# Patient Record
Sex: Female | Born: 1965 | Race: Black or African American | Hispanic: No | Marital: Single | Smoking: Never smoker
Health system: Southern US, Community
[De-identification: ages and names within clinical notes are randomized; demographics above are authoritative.]

## PROBLEM LIST (undated history)

## (undated) DIAGNOSIS — D649 Anemia, unspecified: Secondary | ICD-10-CM

## (undated) DIAGNOSIS — I10 Essential (primary) hypertension: Secondary | ICD-10-CM

## (undated) DIAGNOSIS — F411 Generalized anxiety disorder: Secondary | ICD-10-CM

## (undated) DIAGNOSIS — E119 Type 2 diabetes mellitus without complications: Secondary | ICD-10-CM

## (undated) HISTORY — DX: Generalized anxiety disorder: F41.1

## (undated) HISTORY — DX: Essential (primary) hypertension: I10

## (undated) HISTORY — DX: Anemia, unspecified: D64.9

## (undated) HISTORY — DX: Type 2 diabetes mellitus without complications: E11.9

---

## 2013-03-18 ENCOUNTER — Ambulatory Visit: Payer: Self-pay | Admitting: Family Medicine

## 2013-04-01 ENCOUNTER — Encounter: Payer: Self-pay | Admitting: Family Medicine

## 2013-04-01 ENCOUNTER — Ambulatory Visit (INDEPENDENT_AMBULATORY_CARE_PROVIDER_SITE_OTHER): Payer: BC Managed Care – PPO | Admitting: Family Medicine

## 2013-04-01 ENCOUNTER — Encounter (INDEPENDENT_AMBULATORY_CARE_PROVIDER_SITE_OTHER): Payer: Self-pay

## 2013-04-01 VITALS — BP 150/98 | HR 98 | Resp 18 | Ht 67.0 in | Wt 207.0 lb

## 2013-04-01 DIAGNOSIS — Z1239 Encounter for other screening for malignant neoplasm of breast: Secondary | ICD-10-CM

## 2013-04-01 DIAGNOSIS — IMO0001 Reserved for inherently not codable concepts without codable children: Secondary | ICD-10-CM

## 2013-04-01 DIAGNOSIS — M79641 Pain in right hand: Secondary | ICD-10-CM

## 2013-04-01 DIAGNOSIS — F411 Generalized anxiety disorder: Secondary | ICD-10-CM

## 2013-04-01 DIAGNOSIS — I10 Essential (primary) hypertension: Secondary | ICD-10-CM | POA: Insufficient documentation

## 2013-04-01 DIAGNOSIS — E669 Obesity, unspecified: Secondary | ICD-10-CM

## 2013-04-01 DIAGNOSIS — M79609 Pain in unspecified limb: Secondary | ICD-10-CM

## 2013-04-01 DIAGNOSIS — R030 Elevated blood-pressure reading, without diagnosis of hypertension: Secondary | ICD-10-CM

## 2013-04-01 DIAGNOSIS — G4700 Insomnia, unspecified: Secondary | ICD-10-CM

## 2013-04-01 DIAGNOSIS — E66811 Obesity, class 1: Secondary | ICD-10-CM

## 2013-04-01 DIAGNOSIS — E663 Overweight: Secondary | ICD-10-CM | POA: Insufficient documentation

## 2013-04-01 MED ORDER — TEMAZEPAM 7.5 MG PO CAPS: 7.5000 mg | ORAL_CAPSULE | Freq: Every evening | ORAL | Status: DC | PRN

## 2013-04-01 MED ORDER — BUPROPION HCL ER (SR) 150 MG PO TB12: 150.0000 mg | ORAL_TABLET | Freq: Two times a day (BID) | ORAL | Status: DC

## 2013-04-01 NOTE — Patient Instructions (Signed)
F/u in 5 weeks call if you need me before  Fasting CBC, lipid, , chem 7 HBA1C, TSH., Vit D As soon as possible   New for sleep is restoril, we will also provide you with info re good sleep hygiene pls follow this, no light no soound when sleeping please, and set bedtime  You are referred to ortho re hand pain, using a brace at night  Will likely reduce pain in the interim, seems like you have carpal tunnel syndrom in the right hand  Blood pressure is high today, please commit to daily exercise, changing eating to regular  Times and mainly fruit and vegetable based, weight loss is needed to improve your health also  Continue to look at opportunities to reduce the mental stress associated with daily living  It is important that you exercise regularly at least 30 minutes 5 times a week. If you develop chest pain, have severe difficulty breathing, or feel very tired, stop exercising immediately and seek medical attention    A healthy diet is rich in fruit, vegetables and whole grains. Poultry fish, nuts and beans are a healthy choice for protein rather then red meat. A low sodium diet and drinking 64 ounces of water daily is generally recommended. Oils and sweet should be limited. Carbohydrates especially for those who are diabetic or overweight, should be limited to 60-45 gram per meal. It is important to eat on a regular schedule, at least 3 times daily. Snacks should be primarily fruits, vegetables or nuts.

## 2013-04-08 ENCOUNTER — Telehealth: Payer: Self-pay | Admitting: Family Medicine

## 2013-04-08 MED ORDER — TRIAMTERENE-HCTZ 37.5-25 MG PO TABS: 1.0000 | ORAL_TABLET | Freq: Every day | ORAL | Status: DC

## 2013-04-08 NOTE — Telephone Encounter (Signed)
Message left for patient to return my call.  

## 2013-04-08 NOTE — Telephone Encounter (Signed)
pls let pt know 2 month supply of maxzide has been sent in , 1 daily in the mornings, will go bathroom to urinate more often initially (diuretic in it) call if probs.   Ensure she has apptbackher within next 7 weeks on this med if not needs one re eval BP in next approx 5 weeks

## 2013-04-16 NOTE — Telephone Encounter (Signed)
Patient aware.

## 2013-04-18 ENCOUNTER — Ambulatory Visit
Admission: RE | Admit: 2013-04-18 | Discharge: 2013-04-18 | Disposition: A | Discharge status: Home / Self Care | Payer: BC Managed Care – PPO | Source: Ambulatory Visit | Attending: Family Medicine | Admitting: Family Medicine

## 2013-04-18 DIAGNOSIS — Z1239 Encounter for other screening for malignant neoplasm of breast: Secondary | ICD-10-CM

## 2013-05-03 NOTE — Assessment & Plan Note (Signed)
Sleep hygiene reviewed and written information offered also. Prescription sent for  medication needed.  

## 2013-05-03 NOTE — Assessment & Plan Note (Signed)
No past history, pt is new to practice , no med started today/ DASH diet and commitment to daily physical activity for a minimum of 30 minutes discussed and encouraged, as a part of hypertension management. The importance of attaining a healthy weight is also discussed. Reassess next visit

## 2013-05-03 NOTE — Assessment & Plan Note (Signed)
.   Patient educated about  the importance of commitment to a  minimum of 150 minutes of exercise per week. The importance of healthy food choices with portion control discussed. Encouraged to start a food diary, count calories and to consider  joining a support group. Sample diet sheets offered. Goals set by the patient for the next several months.    

## 2013-05-03 NOTE — Progress Notes (Signed)
   Subjective:    Patient ID: Felicia HookJanet Schneider, female    DOB: 07/21/1965, 48 y.o.   MRN: [id number]  HPI Pt presents in the office to establish care. She has no chronic medical conditions fo which she takes prescription medications. She is concerned about significant weight gain in recent 12 to 18 months, which she attributes to stress.  Has a lot of travel on her job, her Mom is ill and increasingly dependent, and she is divorced with her 2 children in college, does state that their father is supportive. C/o poor sleep, an is aware that she is spending insufficient time and energy attending to her physical needs of regular exercise and good nutrtition, wants to change this   Review of Systems See HPI Denies recent fever or chills. Denies sinus pressure, nasal congestion, ear pain or sore throat. Denies chest congestion, productive cough or wheezing. Denies chest pains, palpitations and leg swelling Denies abdominal pain, nausea, vomiting,diarrhea or constipation.   Denies dysuria, frequency, hesitancy or incontinence. C/o hand pain and numbness with mild weakness, pain awakens her at night and is relieved to some extent with the flick of a hand Denies headaches, seizures, numbness, or tingling. Denies depression, does report  Anxiety and  Insomnia due to normal stresses of life Denies skin break down or rash.        Objective:   Physical Exam BP 150/98  Pulse 98  Resp 18  Ht 5\' 7"  (1.702 m)  Wt 207 lb (93.895 kg)  BMI 32.41 kg/m2  SpO2 100%  LMP 03/31/2013 Patient alert and oriented and in no cardiopulmonary distress.  HEENT: No facial asymmetry, EOMI, no sinus tenderness,  oropharynx pink and moist.  Neck supple no adenopathy.  Chest: Clear to auscultation bilaterally.  CVS: S1, S2 no murmurs, no S3.  ABD: Soft non tender. Bowel sounds normal.  Ext: No edema  MS: Adequate ROM spine, shoulders, hips and knees.  Skin: Intact, no ulcerations or rash noted.  Psych:  Good eye contact, normal affect. Memory intact not anxious or depressed appearing.  CNS: CN 2-12 intact, power, tone and sensation normal throughout.        Assessment & Plan:  Insomnia Sleep hygiene reviewed and written information offered also. Prescription sent for  medication needed.   Hand pain, right History and exam are c/w carpal tunnel synd , will refer to ortho hand for eval and management  GAD (generalized anxiety disorder) Generalized anxiety  Associated with the stresses of dailoy living. Behavior modification to incorporate daily exercise and adequate sleep will greatly alleviate this  Elevated blood pressure No past history, pt is new to practice , no med started today/ DASH diet and commitment to daily physical activity for a minimum of 30 minutes discussed and encouraged, as a part of hypertension management. The importance of attaining a healthy weight is also discussed. Reassess next visit  Obesity (BMI 30.0-34.9)  Patient educated about  the importance of commitment to a  minimum of 150 minutes of exercise per week. The importance of healthy food choices with portion control discussed. Encouraged to start a food diary, count calories and to consider  joining a support group. Sample diet sheets offered. Goals set by the patient for the next several months.

## 2013-05-03 NOTE — Assessment & Plan Note (Signed)
Generalized anxiety  Associated with the stresses of dailoy living. Behavior modification to incorporate daily exercise and adequate sleep will greatly alleviate this

## 2013-05-03 NOTE — Assessment & Plan Note (Signed)
History and exam are c/w carpal tunnel synd , will refer to ortho hand for eval and management

## 2013-06-12 ENCOUNTER — Other Ambulatory Visit: Payer: Self-pay | Admitting: Family Medicine

## 2013-10-30 ENCOUNTER — Other Ambulatory Visit: Payer: Self-pay | Admitting: Family Medicine

## 2014-02-25 ENCOUNTER — Other Ambulatory Visit: Payer: Self-pay | Admitting: Family Medicine

## 2014-02-25 LAB — CBC
HCT: 39 % (ref 36.0–46.0)
Hemoglobin: 12.9 g/dL (ref 12.0–15.0)
MCH: 28.2 pg (ref 26.0–34.0)
MCHC: 33.1 g/dL (ref 30.0–36.0)
MCV: 85.2 fL (ref 78.0–100.0)
MPV: 9.3 fL — ABNORMAL LOW (ref 9.4–12.4)
Platelets: 447 10*3/uL — ABNORMAL HIGH (ref 150–400)
RBC: 4.58 MIL/uL (ref 3.87–5.11)
RDW: 16.6 % — AB (ref 11.5–15.5)
WBC: 6.4 10*3/uL (ref 4.0–10.5)

## 2014-02-25 LAB — HEMOGLOBIN A1C
HEMOGLOBIN A1C: 7 % — AB (ref <5.7)
MEAN PLASMA GLUCOSE: 154 mg/dL — AB (ref <117)

## 2014-02-25 LAB — LIPID PANEL
CHOLESTEROL: 156 mg/dL (ref 0–200)
HDL: 42 mg/dL (ref >39)
LDL Cholesterol: 90 mg/dL (ref 0–99)
Total CHOL/HDL Ratio: 3.7 Ratio
Triglycerides: 119 mg/dL (ref <150)
VLDL: 24 mg/dL (ref 0–40)

## 2014-02-25 LAB — BASIC METABOLIC PANEL
BUN: 21 mg/dL (ref 6–23)
CALCIUM: 9.6 mg/dL (ref 8.4–10.5)
CO2: 29 mEq/L (ref 19–32)
CREATININE: 0.98 mg/dL (ref 0.50–1.10)
Chloride: 102 mEq/L (ref 96–112)
Glucose, Bld: 136 mg/dL — ABNORMAL HIGH (ref 70–99)
POTASSIUM: 4 meq/L (ref 3.5–5.3)
Sodium: 140 mEq/L (ref 135–145)

## 2014-02-25 LAB — VITAMIN D 25 HYDROXY (VIT D DEFICIENCY, FRACTURES): VIT D 25 HYDROXY: 37 ng/mL (ref 30–100)

## 2014-02-25 LAB — TSH: TSH: 1.678 u[IU]/mL (ref 0.350–4.500)

## 2014-03-02 ENCOUNTER — Encounter: Payer: Self-pay | Admitting: Family Medicine

## 2014-03-02 ENCOUNTER — Ambulatory Visit (INDEPENDENT_AMBULATORY_CARE_PROVIDER_SITE_OTHER): Payer: BC Managed Care – PPO | Admitting: Family Medicine

## 2014-03-02 ENCOUNTER — Encounter (INDEPENDENT_AMBULATORY_CARE_PROVIDER_SITE_OTHER): Payer: Self-pay

## 2014-03-02 VITALS — BP 130/82 | HR 74 | Resp 18 | Ht 67.0 in | Wt 212.0 lb

## 2014-03-02 DIAGNOSIS — R030 Elevated blood-pressure reading, without diagnosis of hypertension: Secondary | ICD-10-CM

## 2014-03-02 DIAGNOSIS — E119 Type 2 diabetes mellitus without complications: Secondary | ICD-10-CM

## 2014-03-02 DIAGNOSIS — E785 Hyperlipidemia, unspecified: Secondary | ICD-10-CM

## 2014-03-02 DIAGNOSIS — E1159 Type 2 diabetes mellitus with other circulatory complications: Secondary | ICD-10-CM | POA: Insufficient documentation

## 2014-03-02 DIAGNOSIS — Z23 Encounter for immunization: Secondary | ICD-10-CM

## 2014-03-02 DIAGNOSIS — E1165 Type 2 diabetes mellitus with hyperglycemia: Secondary | ICD-10-CM | POA: Insufficient documentation

## 2014-03-02 DIAGNOSIS — M549 Dorsalgia, unspecified: Secondary | ICD-10-CM

## 2014-03-02 DIAGNOSIS — E669 Obesity, unspecified: Secondary | ICD-10-CM

## 2014-03-02 DIAGNOSIS — IMO0001 Reserved for inherently not codable concepts without codable children: Secondary | ICD-10-CM

## 2014-03-02 DIAGNOSIS — M545 Low back pain: Secondary | ICD-10-CM

## 2014-03-02 DIAGNOSIS — E1169 Type 2 diabetes mellitus with other specified complication: Secondary | ICD-10-CM

## 2014-03-02 MED ORDER — METHYLPREDNISOLONE ACETATE 80 MG/ML IJ SUSP
80.0000 mg | Freq: Once | INTRAMUSCULAR | Status: AC
  Administered 2014-03-02: 80 mg via INTRAMUSCULAR

## 2014-03-02 MED ORDER — PREDNISONE 5 MG PO TABS: 5.0000 mg | ORAL_TABLET | Freq: Two times a day (BID) | ORAL | Status: DC

## 2014-03-02 MED ORDER — METFORMIN HCL 500 MG PO TABS: 500.0000 mg | ORAL_TABLET | Freq: Two times a day (BID) | ORAL | Status: DC

## 2014-03-02 MED ORDER — KETOROLAC TROMETHAMINE 60 MG/2ML IM SOLN
60.0000 mg | Freq: Once | INTRAMUSCULAR | Status: AC
  Administered 2014-03-02: 60 mg via INTRAMUSCULAR

## 2014-03-02 MED ORDER — IBUPROFEN 800 MG PO TABS: 800.0000 mg | ORAL_TABLET | Freq: Two times a day (BID) | ORAL | Status: DC | PRN

## 2014-03-02 MED ORDER — RAMIPRIL 2.5 MG PO CAPS: 2.5000 mg | ORAL_CAPSULE | Freq: Every day | ORAL | Status: DC

## 2014-03-02 NOTE — Progress Notes (Signed)
   Subjective:    Patient ID: Felicia Schneider, female    DOB: 07/16/1965, 48 y.o.   MRN: [id number]  HPI The PT is here for follow up and re-evaluation of chronic medical conditions, medication management and review of any available recent lab and radiology data.  Preventive health is updated, specifically  Cancer screening and Immunization.    The PT denies any adverse reactions to current medications . New onset  low back pain radiating to buttock limiting mobility , aggravated by excessive physical activity this past weekend      Review of Systems See HPI Denies recent fever or chills. Denies sinus pressure, nasal congestion, ear pain or sore throat. Denies chest congestion, productive cough or wheezing. Denies chest pains, palpitations and leg swelling Denies abdominal pain, nausea, vomiting,diarrhea or constipation.   Denies dysuria, frequency, hesitancy or incontinence.  Denies headaches, seizures,  Denies depression, anxiety or insomnia. Denies skin break down or rash.        Objective:   Physical Exam BP 130/82 mmHg  Pulse 74  Resp 18  Ht 5\' 7"  (1.702 m)  Wt 212 lb 0.6 oz (96.181 kg)  BMI 33.20 kg/m2  SpO2 97% Patient alert and oriented and in no cardiopulmonary distress.Pt in pain with marked limitation in mobility  HEENT: No facial asymmetry, EOMI,   oropharynx pink and moist.  Neck supple no JVD, no mass.  Chest: Clear to auscultation bilaterally.  CVS: S1, S2 no murmurs, no S3.Regular rate.  ABD: Soft non tender.   Ext: No edema  MS: decreased ROM lumbar spine, normal in shoulders, hips and knees  Skin: Intact, no ulcerations or rash noted.  Psych: Good eye contact, normal affect. Memory intact not anxious or depressed appearing.  CNS: CN 2-12 intact, power,  normal throughout.no focal deficits noted.        Assessment & Plan:  Diabetes mellitus type 2 in obese Patient educated about the importance of limiting  Carbohydrate intake , the need  to commit to daily physical activity for a minimum of 30 minutes , and to commit weight loss. The fact that changes in all these areas will reduce or eliminate all together the development of diabetes is stressed.   Diabetic ed started at visit, pt referred to individual class Once daily testing with blood glucose ranges are reviewed  Elevated blood pressure Controlled, no change in medication Add ramipril fdor renal protection DASH diet and commitment to daily physical activity for a minimum of 30 minutes discussed and encouraged, as a part of hypertension management. The importance of attaining a healthy weight is also discussed.   Obesity (BMI 30.0-34.9) /Deteriorated. Patient re-educated about  the importance of commitment to a  minimum of 150 minutes of exercise per week. The importance of healthy food choices with portion control discussed. Encouraged to start a food diary, count calories and to consider  joining a support group. Sample diet sheets offered. Goals set by the patient for the next several months.     Need for diphtheria-tetanus-pertussis (Tdap) vaccine, adult/adolescent Vaccine administered at visit.   Acute back pain Anti inflammatories in office and prescribed

## 2014-03-02 NOTE — Patient Instructions (Addendum)
F/u in 3 month, call if you need me before  TdAP today in office  You are referred to diabetic class.    Commit to exercise for 30 mins at least 5 days per week    New is metformin 500 mg one twice daily, start taking one daily for 4 days , then increase to twice daily as prescribed  For back pain toradol 72m and depo medrol 80-mg is given IM, then ibuprofen and prednisone are prescribed for 5 days only, call back if no better for testing etc  New additional medication is prescribed for kidney protection, altace one daily  Hba1C , chem 7 and EGFR in 3 month, NEED TO KEEP APPT

## 2014-03-04 LAB — MICROALBUMIN / CREATININE URINE RATIO
CREATININE, URINE: 157.7 mg/dL
Microalb Creat Ratio: 6.3 mg/g (ref 0.0–30.0)
Microalb, Ur: 1 mg/dL (ref <2.0)

## 2014-03-10 DIAGNOSIS — Z23 Encounter for immunization: Secondary | ICD-10-CM | POA: Insufficient documentation

## 2014-03-10 DIAGNOSIS — M549 Dorsalgia, unspecified: Secondary | ICD-10-CM | POA: Insufficient documentation

## 2014-03-10 NOTE — Assessment & Plan Note (Signed)
Vaccine administered at visit.  

## 2014-03-10 NOTE — Assessment & Plan Note (Signed)
Controlled, no change in medication Add ramipril fdor renal protection DASH diet and commitment to daily physical activity for a minimum of 30 minutes discussed and encouraged, as a part of hypertension management. The importance of attaining a healthy weight is also discussed.

## 2014-03-10 NOTE — Assessment & Plan Note (Signed)
Deteriorated. Patient re-educated about  the importance of commitment to a  minimum of 150 minutes of exercise per week. The importance of healthy food choices with portion control discussed. Encouraged to start a food diary, count calories and to consider  joining a support group. Sample diet sheets offered. Goals set by the patient for the next several months.    

## 2014-03-10 NOTE — Assessment & Plan Note (Signed)
Anti inflammatories in office and prescribed

## 2014-03-10 NOTE — Assessment & Plan Note (Signed)
Patient educated about the importance of limiting  Carbohydrate intake , the need to commit to daily physical activity for a minimum of 30 minutes , and to commit weight loss. The fact that changes in all these areas will reduce or eliminate all together the development of diabetes is stressed.   Diabetic ed started at visit, pt referred to individual class Once daily testing with blood glucose ranges are reviewed

## 2014-03-11 ENCOUNTER — Encounter: Payer: Self-pay | Admitting: Women's Health

## 2014-03-11 ENCOUNTER — Ambulatory Visit (INDEPENDENT_AMBULATORY_CARE_PROVIDER_SITE_OTHER): Payer: BC Managed Care – PPO | Admitting: Women's Health

## 2014-03-11 ENCOUNTER — Other Ambulatory Visit (HOSPITAL_COMMUNITY)
Admission: RE | Admit: 2014-03-11 | Discharge: 2014-03-11 | Disposition: A | Discharge status: Home / Self Care | Payer: BC Managed Care – PPO | Source: Ambulatory Visit | Attending: Women's Health | Admitting: Women's Health

## 2014-03-11 VITALS — BP 130/82 | Ht 70.0 in | Wt 205.2 lb

## 2014-03-11 DIAGNOSIS — N760 Acute vaginitis: Secondary | ICD-10-CM

## 2014-03-11 DIAGNOSIS — B9689 Other specified bacterial agents as the cause of diseases classified elsewhere: Secondary | ICD-10-CM

## 2014-03-11 DIAGNOSIS — A499 Bacterial infection, unspecified: Secondary | ICD-10-CM

## 2014-03-11 DIAGNOSIS — Z01419 Encounter for gynecological examination (general) (routine) without abnormal findings: Secondary | ICD-10-CM

## 2014-03-11 DIAGNOSIS — Z1151 Encounter for screening for human papillomavirus (HPV): Secondary | ICD-10-CM | POA: Insufficient documentation

## 2014-03-11 LAB — WET PREP FOR TRICH, YEAST, CLUE
TRICH WET PREP: NONE SEEN
YEAST WET PREP: NONE SEEN

## 2014-03-11 MED ORDER — METRONIDAZOLE 0.75 % VA GEL: VAGINAL | Status: DC

## 2014-03-11 NOTE — Progress Notes (Signed)
Felicia HookJanet Schneider 06/08/1965 [id number]    History:    Presents for annual exam. New patient. Mostly monthly cycle/condoms. Monthly prior to this past year. Reports normal Pap and mammogram history.  Past medical history, past surgical history, family history and social history were all reviewed and documented in the EPIC chart. 50 year old son at [facility] [employer] [facility], 50 year old daughter at [employer],  both doing well. Mother hypertension/hypercholesterolemia/diabetes.  ROS:  A ROS was performed and pertinent positives and negatives are included.  Exam:  Filed Vitals:   03/11/14 0848  BP: 130/82    General appearance:  Normal Thyroid:  Symmetrical, normal in size, without palpable masses or nodularity. Respiratory  Auscultation:  Clear without wheezing or rhonchi Cardiovascular  Auscultation:  Regular rate, without rubs, murmurs or gallops  Edema/varicosities:  Not grossly evident Abdominal  Soft,nontender, without masses, guarding or rebound.  Liver/spleen:  No organomegaly noted  Hernia:  None appreciated  Skin  Inspection:  Grossly normal   Breasts: Examined lying and sitting.     Right: Without masses, retractions, discharge or axillary adenopathy.     Left: Without masses, retractions, discharge or axillary adenopathy. Gentitourinary   Inguinal/mons:  Normal without inguinal adenopathy  External genitalia:  Normal  BUS/Urethra/Skene's glands:  Normal  Vagina: Malodorous adherent discharge wet prep positive for amines, clues, TNTC bacteria  Cervix:  Normal  Uterus:   normal in size, shape and contour.  Midline and mobile  Adnexa/parametria:     Rt: Without masses or tenderness.   Lt: Without masses or tenderness.  Anus and perineum: Normal  Digital rectal exam: Normal sphincter tone without palpated masses or tenderness  Assessment/Plan:  48 y.o.MBF G2P2  for annual exam with no complaints.  Normal perimenopausal exam/condoms Diabetes-primary care manages labs and  meds Obesity Bacterial vaginosis  Plan: SBE's, regular exercise, calcium rich diet, vitamin D 2000 daily encouraged. Vitamin D 37 at primary care. Decrease calories for weight loss encouraged. Pap with HR HPV typing, new screening guidelines reviewed. Contraception reviewed and declined will continue condoms. MetroGel vaginal cream 1 applicator at bedtime 5, alcohol precautions reviewed.  Felicia Schneider J WHNP, 1:40 PM 03/11/2014

## 2014-03-12 LAB — CYTOLOGY - PAP

## 2014-03-16 ENCOUNTER — Encounter: Payer: Self-pay | Admitting: Internal Medicine

## 2014-03-17 ENCOUNTER — Encounter: Payer: Self-pay | Admitting: Women's Health

## 2014-03-17 ENCOUNTER — Other Ambulatory Visit: Payer: Self-pay

## 2014-03-17 ENCOUNTER — Telehealth: Payer: Self-pay | Admitting: Family Medicine

## 2014-03-17 MED ORDER — TRIAMTERENE-HCTZ 37.5-25 MG PO TABS: 1.0000 | ORAL_TABLET | Freq: Every day | ORAL | Status: DC

## 2014-03-17 NOTE — Telephone Encounter (Signed)
Med refilled to requested pharmacy  

## 2014-03-19 ENCOUNTER — Other Ambulatory Visit: Payer: Self-pay

## 2014-03-19 MED ORDER — TRIAMTERENE-HCTZ 37.5-25 MG PO TABS: 1.0000 | ORAL_TABLET | Freq: Every day | ORAL | Status: DC

## 2014-05-04 ENCOUNTER — Ambulatory Visit (AMBULATORY_SURGERY_CENTER): Payer: Self-pay | Admitting: *Deleted

## 2014-05-04 VITALS — Ht 69.0 in | Wt 202.0 lb

## 2014-05-04 DIAGNOSIS — Z1211 Encounter for screening for malignant neoplasm of colon: Secondary | ICD-10-CM

## 2014-05-04 MED ORDER — MOVIPREP 100 G PO SOLR
ORAL | Status: DC
Start: 2014-05-04 — End: 2014-05-08

## 2014-05-04 NOTE — Progress Notes (Signed)
No egg or soy allergy  No trouble moving neck- never has had anesthesia  No diet medications  [facility]

## 2014-05-06 ENCOUNTER — Telehealth: Payer: Self-pay | Admitting: Internal Medicine

## 2014-05-06 NOTE — Telephone Encounter (Signed)
If temp < 100F may proceed with colonoscopy.

## 2014-05-06 NOTE — Telephone Encounter (Signed)
Pt states she has developed a cough and that she had a temp of 99.3 today. She has been taking theraflu for her cough. Pt wants to know if she needs to reschedule her colon that is scheduled for Friday. Please advise.

## 2014-05-07 NOTE — Telephone Encounter (Signed)
Patient notified of recommendation. 

## 2014-05-08 ENCOUNTER — Ambulatory Visit (AMBULATORY_SURGERY_CENTER): Payer: BLUE CROSS/BLUE SHIELD | Admitting: Internal Medicine

## 2014-05-08 ENCOUNTER — Encounter: Payer: Self-pay | Admitting: Internal Medicine

## 2014-05-08 VITALS — BP 133/78 | HR 77 | Temp 100.2°F | Resp 16 | Ht 70.0 in | Wt 205.0 lb

## 2014-05-08 DIAGNOSIS — Z1211 Encounter for screening for malignant neoplasm of colon: Secondary | ICD-10-CM

## 2014-05-08 LAB — GLUCOSE, CAPILLARY
Glucose-Capillary: 110 mg/dL — ABNORMAL HIGH (ref 70–99)
Glucose-Capillary: 90 mg/dL (ref 70–99)

## 2014-05-08 MED ORDER — SODIUM CHLORIDE 0.9 % IV SOLN: 500.0000 mL | INTRAVENOUS | Status: DC

## 2014-05-08 NOTE — Progress Notes (Addendum)
Pt in today wit temp 100.2. Okay to proceed with procedure per Dr Juanda ChanceBrodie. Some cough started after PV Tuesday , but no other s/s

## 2014-05-08 NOTE — Patient Instructions (Signed)
Normal colon today, repeat colonoscopy in 10 years. Try to follow high fiber diet. Handout given on high fiber diet. Call us with any questions or concerns. Thank you!  YOU HAD AN ENDOSCOPIC PROCEDURE TODAY AT THE [facility]: Refer to the procedure report that was given to you for any specific questions about what was found during the examination.  If the procedure report does not answer your questions, please call your gastroenterologist to clarify.  If you requested that your care partner not be given the details of your procedure findings, then the procedure report has been included in a sealed envelope for you to review at your convenience later.  YOU SHOULD EXPECT: Some feelings of bloating in the abdomen. Passage of more gas than usual.  Walking can help get rid of the air that was put into your GI tract during the procedure and reduce the bloating. If you had a lower endoscopy (such as a colonoscopy or flexible sigmoidoscopy) you may notice spotting of blood in your stool or on the toilet paper. If you underwent a bowel prep for your procedure, then you may not have a normal bowel movement for a few days.  DIET: Your first meal following the procedure should be a light meal and then it is ok to progress to your normal diet.  A half-sandwich or bowl of soup is an example of a good first meal.  Heavy or fried foods are harder to digest and may make you feel nauseous or bloated.  Likewise meals heavy in dairy and vegetables can cause extra gas to form and this can also increase the bloating.  Drink plenty of fluids but you should avoid alcoholic beverages for 24 hours.  ACTIVITY: Your care partner should take you home directly after the procedure.  You should plan to take it easy, moving slowly for the rest of the day.  You can resume normal activity the day after the procedure however you should NOT DRIVE or use heavy machinery for 24 hours (because of the sedation medicines used during  the test).    SYMPTOMS TO REPORT IMMEDIATELY: A gastroenterologist can be reached at any hour.  During normal business hours, 8:30 AM to 5:00 PM Monday through Friday, call [phone].  After hours and on weekends, please call the GI answering service at [phone] who will take a message and have the physician on call contact you.   Following lower endoscopy (colonoscopy or flexible sigmoidoscopy):  Excessive amounts of blood in the stool  Significant tenderness or worsening of abdominal pains  Swelling of the abdomen that is new, acute  Fever of 100F or higher  Following upper endoscopy (EGD)  Vomiting of blood or coffee ground material  New chest pain or pain under the shoulder blades  Painful or persistently difficult swallowing  New shortness of breath  Fever of 100F or higher  Black, tarry-looking stools  FOLLOW UP: If any biopsies were taken you will be contacted by phone or by letter within the next 1-3 weeks.  Call your gastroenterologist if you have not heard about the biopsies in 3 weeks.  Our staff will call the home number listed on your records the next business day following your procedure to check on you and address any questions or concerns that you may have at that time regarding the information given to you following your procedure. This is a courtesy call and so if there is no answer at the home number and we have  heard from you through the emergency physician on call, we will assume that you have returned to your regular daily activities without incident.  SIGNATURES/CONFIDENTIALITY: You and/or your care partner have signed paperwork which will be entered into your electronic medical record.  These signatures attest to the fact that that the information above on your After Visit Summary has been reviewed and is understood.  Full responsibility of the confidentiality of this discharge information lies with you and/or your care-partner. 

## 2014-05-08 NOTE — Op Note (Signed)
[facility] [address] [place], [id number]   COLONOSCOPY PROCEDURE REPORT  PATIENT: Felicia Schneider, Sawyer  MR#: [id number] BIRTHDATE: 17-Oct-1965 , 48  yrs. old GENDER: female ENDOSCOPIST: Hart Carwinora M Binnie Droessler, MD REFERRED BY:Dr margaret Simpson PROCEDURE DATE:  05/08/2014 PROCEDURE:   Colonoscopy, screening First Screening Colonoscopy - Avg.  risk and is 50 yrs.  old or older Yes.  Prior Negative Screening - Now for repeat screening. N/A  History of Adenoma - Now for follow-up colonoscopy & has been > or = to 3 yrs.  N/A  Polyps Removed Today? No.  Polyps Removed Today? No.  Recommend repeat exam, <10 yrs? Polyps Removed Today? No.  Recommend repeat exam, <10 yrs? No. ASA CLASS:   Class I INDICATIONS:average risk patient for colon cancer. MEDICATIONS: Monitored anesthesia care and Propofol 280 mg IV  DESCRIPTION OF PROCEDURE:   After the risks benefits and alternatives of the procedure were thoroughly explained, informed consent was obtained.  The digital rectal exam revealed no abnormalities of the rectum.   The LB [id number]-[id number]  endoscope was introduced through the anus and advanced to the cecum, which was identified by both the appendix and ileocecal valve. No adverse events experienced.   The quality of the prep was good, using MoviPrep  The instrument was then slowly withdrawn as the colon was fully examined.      COLON FINDINGS: A normal appearing cecum, ileocecal valve, and appendiceal orifice were identified.  The ascending, transverse, descending, sigmoid colon, and rectum appeared unremarkable. Retroflexed views revealed no abnormalities. The time to cecum=17 minutes 04 seconds.  Withdrawal time=6 minutes 10 seconds.  The scope was withdrawn and the procedure completed. COMPLICATIONS: There were no immediate complications.  ENDOSCOPIC IMPRESSION: Normal colonoscopy  RECOMMENDATIONS: High fiber diet Recall colonoscopy in 10 years  eSigned:  Hart Carwinora M  Nazanin Kinner, MD 05/08/2014 10:23 AM   cc:

## 2014-05-08 NOTE — Progress Notes (Signed)
A/ox3 pleased with MAC, report to Robbin RN 

## 2014-05-11 ENCOUNTER — Other Ambulatory Visit: Payer: Self-pay | Admitting: Family Medicine

## 2014-05-11 ENCOUNTER — Telehealth: Payer: Self-pay | Admitting: *Deleted

## 2014-05-11 NOTE — Telephone Encounter (Signed)
  Follow up Call-  Call back number 05/08/2014  Post procedure Call Back phone  # [phone]  Permission to leave phone message Yes     Patient questions:  Do you have a fever, pain , or abdominal swelling? Yes.   Pain Score  0 *  Have you tolerated food without any problems? Yes.    Have you been able to return to your normal activities? Yes.    Do you have any questions about your discharge instructions: Diet   No. Medications  No. Follow up visit  No.  Do you have questions or concerns about your Care? No.  Actions: * If pain score is 4 or above: No action needed, pain <4. Patient stated she did have a fever over the weekend, but it was related to flu . Patient home from work today related to flu. Encouraged increase in fluids and to call if she needs us.

## 2014-06-01 ENCOUNTER — Other Ambulatory Visit: Payer: Self-pay

## 2014-06-01 DIAGNOSIS — Z1231 Encounter for screening mammogram for malignant neoplasm of breast: Secondary | ICD-10-CM

## 2014-06-09 ENCOUNTER — Encounter: Payer: Self-pay | Admitting: Family Medicine

## 2014-06-09 ENCOUNTER — Ambulatory Visit (INDEPENDENT_AMBULATORY_CARE_PROVIDER_SITE_OTHER): Payer: BLUE CROSS/BLUE SHIELD | Admitting: Family Medicine

## 2014-06-09 ENCOUNTER — Ambulatory Visit: Payer: BC Managed Care – PPO | Admitting: Family Medicine

## 2014-06-09 VITALS — BP 120/90 | HR 76 | Resp 18 | Ht 70.0 in | Wt 203.0 lb

## 2014-06-09 DIAGNOSIS — E663 Overweight: Secondary | ICD-10-CM | POA: Diagnosis not present

## 2014-06-09 DIAGNOSIS — E669 Obesity, unspecified: Secondary | ICD-10-CM

## 2014-06-09 DIAGNOSIS — E119 Type 2 diabetes mellitus without complications: Secondary | ICD-10-CM

## 2014-06-09 DIAGNOSIS — E1169 Type 2 diabetes mellitus with other specified complication: Secondary | ICD-10-CM

## 2014-06-09 DIAGNOSIS — G4700 Insomnia, unspecified: Secondary | ICD-10-CM

## 2014-06-09 DIAGNOSIS — I10 Essential (primary) hypertension: Secondary | ICD-10-CM | POA: Diagnosis not present

## 2014-06-09 DIAGNOSIS — F411 Generalized anxiety disorder: Secondary | ICD-10-CM

## 2014-06-09 DIAGNOSIS — Z1159 Encounter for screening for other viral diseases: Secondary | ICD-10-CM | POA: Diagnosis not present

## 2014-06-09 NOTE — Assessment & Plan Note (Addendum)
Improved , though unControlled, ramipril added, for renal protection and should also improve the diastolic pressure DASH diet and commitment to daily physical activity for a minimum of 30 minutes discussed and encouraged, as a part of hypertension management. The importance of attaining a healthy weight is also discussed.  BP/Weight 06/09/2014 05/08/2014 05/04/2014 03/11/2014 03/02/2014 04/01/2013  Systolic BP 120 133 - 130 130 161150  Diastolic BP 90 78 - 82 82 98  Wt. (Lbs) 203 205 202 205.2 212.04 207  BMI 27.53 29.41 29.82 29.44 33.2 32.41    CMP Latest Ref Rng 02/25/2014  Glucose 70 - 99 mg/dL 096(E136(H)  BUN 6 - 23 mg/dL 21  Creatinine 4.540.50 - 0.981.10 mg/dL 1.190.98  Sodium 147135 - 829145 mEq/L 140  Potassium 3.5 - 5.3 mEq/L 4.0  Chloride 96 - 112 mEq/L 102  CO2 19 - 32 mEq/L 29  Calcium 8.4 - 10.5 mg/dL 9.6

## 2014-06-09 NOTE — Assessment & Plan Note (Addendum)
Improved, however uses sleep aid on regular basis, a lot of stress in her life Sleep hygiene reviewed and written information offered also. Prescription sent for  medication needed.

## 2014-06-09 NOTE — Patient Instructions (Signed)
F/u in 4 month, call if you need me before    HBa1C, chem 7 and EGFR and HIV today  CONGRATS on weight loss due to change in eating  It is important that you exercise regularly at least 30 minutes 5 times a week. If you develop chest pain, have severe difficulty breathing, or feel very tired, stop exercising immediately and seek medical attention ` Weight loss goal of 6 to 8 pounds the next 4 months Think about stress management esp on the job  Non fasting HBA1C , chem 7 and EGFR in 4 month

## 2014-06-09 NOTE — Assessment & Plan Note (Signed)
Improved Patient re-educated about  the importance of commitment to a  minimum of 150 minutes of exercise per week.  The importance of healthy food choices with portion control discussed. Encouraged to start a food diary, count calories and to consider  joining a support group. Sample diet sheets offered. Goals set by the patient for the next several months.   Wt Readings from Last 3 Encounters:  06/09/14 203 lb (92.08 kg)  05/08/14 205 lb (92.987 kg)  05/04/14 202 lb (91.627 kg)    Body mass index is 27.53 kg/(m^2).  Current exercise per week 45 minutes.

## 2014-06-10 ENCOUNTER — Ambulatory Visit
Admission: RE | Admit: 2014-06-10 | Discharge: 2014-06-10 | Disposition: A | Discharge status: Home / Self Care | Payer: BLUE CROSS/BLUE SHIELD | Source: Ambulatory Visit

## 2014-06-10 ENCOUNTER — Encounter: Payer: Self-pay | Admitting: Family Medicine

## 2014-06-10 DIAGNOSIS — Z1231 Encounter for screening mammogram for malignant neoplasm of breast: Secondary | ICD-10-CM

## 2014-06-10 LAB — COMPLETE METABOLIC PANEL WITH GFR
ALBUMIN: 4.6 g/dL (ref 3.5–5.2)
ALT: 34 U/L (ref 0–35)
AST: 20 U/L (ref 0–37)
Alkaline Phosphatase: 65 U/L (ref 39–117)
BUN: 19 mg/dL (ref 6–23)
CHLORIDE: 100 meq/L (ref 96–112)
CO2: 29 mEq/L (ref 19–32)
Calcium: 9.9 mg/dL (ref 8.4–10.5)
Creat: 0.85 mg/dL (ref 0.50–1.10)
GFR, Est Non African American: 80 mL/min
GLUCOSE: 84 mg/dL (ref 70–99)
Potassium: 4 mEq/L (ref 3.5–5.3)
SODIUM: 139 meq/L (ref 135–145)
TOTAL PROTEIN: 8.2 g/dL (ref 6.0–8.3)
Total Bilirubin: 0.6 mg/dL (ref 0.2–1.2)

## 2014-06-10 LAB — HIV ANTIBODY (ROUTINE TESTING W REFLEX): HIV 1&2 Ab, 4th Generation: NONREACTIVE

## 2014-06-10 LAB — HEMOGLOBIN A1C
HEMOGLOBIN A1C: 6.4 % — AB (ref <5.7)
MEAN PLASMA GLUCOSE: 137 mg/dL — AB (ref <117)

## 2014-06-10 NOTE — Assessment & Plan Note (Signed)
Improved , but needs to  work on coping techniques in stressful situations, esp on the job, she has access to treadmill and needs to use this

## 2014-06-10 NOTE — Assessment & Plan Note (Signed)
Improved, which is great Patient educated about the importance of limiting  Carbohydrate intake , the need to commit to daily physical activity for a minimum of 30 minutes , and to commit weight loss. The fact that changes in all these areas will reduce or eliminate all together the development of diabetes is stressed.   Diabetic Labs Latest Ref Rng 06/09/2014 03/02/2014 02/25/2014  HbA1c <5.7 % 6.4(H) - 7.0(H)  Microalbumin <2.0 mg/dL - 1.0 -  Micro/Creat Ratio 0.0 - 30.0 mg/g - 6.3 -  Chol 0 - 200 mg/dL - - 409156  HDL >81>39 mg/dL - - 42  Calc LDL 0 - 99 mg/dL - - 90  Triglycerides <191<150 mg/dL - - 478119  Creatinine 2.950.50 - 1.10 mg/dL 6.210.85 - 3.080.98   BP/Weight 06/09/2014 05/08/2014 05/04/2014 03/11/2014 03/02/2014 04/01/2013  Systolic BP 120 133 - 130 130 657150  Diastolic BP 90 78 - 82 82 98  Wt. (Lbs) 203 205 202 205.2 212.04 207  BMI 27.53 29.41 29.82 29.44 33.2 32.41   Foot/eye exam completion dates 06/09/2014  Foot Form Completion Done

## 2014-06-14 NOTE — Progress Notes (Signed)
Subjective:    Patient ID: Felicia Schneider, female    DOB: 09/17/1965, 49 y.o.   MRN: [id number]  HPI The PT is here for follow up and re-evaluation of chronic medical conditions, medication management and review of any available recent lab and radiology data.  Preventive health is updated, specifically  Cancer screening and Immunization.   Questions or concerns regarding consultations or procedures which the PT has had in the interim are  addressed. The PT denies any adverse reactions to current medications since the last visit.  There are no new concerns.  There are no specific complaints  Denies polyuria, polydipsia, blurred vision , or hypoglycemic episodes.       Review of Systems See HPI Denies recent fever or chills. Denies sinus pressure, nasal congestion, ear pain or sore throat. Denies chest congestion, productive cough or wheezing. Denies chest pains, palpitations and leg swelling Denies abdominal pain, nausea, vomiting,diarrhea or constipation.   Denies dysuria, frequency, hesitancy or incontinence. Denies joint pain, swelling and limitation in mobility. Denies headaches, seizures, numbness, or tingling. Denies depression, anxiety or insomnia. Denies skin break down or rash.        Objective:   Physical Exam   BP 120/90 mmHg  Pulse 76  Resp 18  Ht 5\' 10"  (1.778 m)  Wt 203 lb (92.08 kg)  BMI 29.13 kg/m2  SpO2 97%  Patient alert and oriented and in no cardiopulmonary distress.  HEENT: No facial asymmetry, EOMI,   oropharynx pink and moist.  Neck supple no JVD, no mass.  Chest: Clear to auscultation bilaterally.  CVS: S1, S2 no murmurs, no S3.Regular rate.  ABD: Soft non tender.   Ext: No edema  MS: Adequate ROM spine, shoulders, hips and knees.  Skin: Intact, no ulcerations or rash noted.  Psych: Good eye contact, normal affect. Memory intact not anxious or depressed appearing.  CNS: CN 2-12 intact, power,  normal throughout.no focal deficits  noted.        Assessment & Plan:  Essential hypertension Improved , though unControlled, ramipril added, for renal protection and should also improve the diastolic pressure DASH diet and commitment to daily physical activity for a minimum of 30 minutes discussed and encouraged, as a part of hypertension management. The importance of attaining a healthy weight is also discussed.  BP/Weight 06/09/2014 05/08/2014 05/04/2014 03/11/2014 03/02/2014 04/01/2013  Systolic BP 120 133 - 130 130 119150  Diastolic BP 90 78 - 82 82 98  Wt. (Lbs) 203 205 202 205.2 212.04 207  BMI 27.53 29.41 29.82 29.44 33.2 32.41    CMP Latest Ref Rng 02/25/2014  Glucose 70 - 99 mg/dL 147(W136(H)  BUN 6 - 23 mg/dL 21  Creatinine 2.950.50 - 6.211.10 mg/dL 3.080.98  Sodium 657135 - 846145 mEq/L 140  Potassium 3.5 - 5.3 mEq/L 4.0  Chloride 96 - 112 mEq/L 102  CO2 19 - 32 mEq/L 29  Calcium 8.4 - 10.5 mg/dL 9.6        Overweight (BMI 25.0-29.9) Improved Patient re-educated about  the importance of commitment to a  minimum of 150 minutes of exercise per week.  The importance of healthy food choices with portion control discussed. Encouraged to start a food diary, count calories and to consider  joining a support group. Sample diet sheets offered. Goals set by the patient for the next several months.   Wt Readings from Last 3 Encounters:  06/09/14 203 lb (92.08 kg)  05/08/14 205 lb (92.987 kg)  05/04/14 202 lb (91.627 kg)  Body mass index is 27.53 kg/(m^2).  Current exercise per week 45 minutes.    Insomnia Improved, however uses sleep aid on regular basis, a lot of stress in her life Sleep hygiene reviewed and written information offered also. Prescription sent for  medication needed.    GAD (generalized anxiety disorder) Improved , but needs to  work on coping techniques in stressful situations, esp on the job, she has access to treadmill and needs to use this   Type 2 diabetes mellitus Improved, which is  great Patient educated about the importance of limiting  Carbohydrate intake , the need to commit to daily physical activity for a minimum of 30 minutes , and to commit weight loss. The fact that changes in all these areas will reduce or eliminate all together the development of diabetes is stressed.   Diabetic Labs Latest Ref Rng 06/09/2014 03/02/2014 02/25/2014  HbA1c <5.7 % 6.4(H) - 7.0(H)  Microalbumin <2.0 mg/dL - 1.0 -  Micro/Creat Ratio 0.0 - 30.0 mg/g - 6.3 -  Chol 0 - 200 mg/dL - - 161  HDL >09 mg/dL - - 42  Calc LDL 0 - 99 mg/dL - - 90  Triglycerides <604 mg/dL - - 540  Creatinine 9.81 - 1.10 mg/dL 1.91 - 4.78   BP/Weight 06/09/2014 05/08/2014 05/04/2014 03/11/2014 03/02/2014 04/01/2013  Systolic BP 120 133 - 130 130 295  Diastolic BP 90 78 - 82 82 98  Wt. (Lbs) 203 205 202 205.2 212.04 207  BMI 27.53 29.41 29.82 29.44 33.2 32.41   Foot/eye exam completion dates 06/09/2014  Foot Form Completion Done

## 2014-08-09 ENCOUNTER — Other Ambulatory Visit: Payer: Self-pay | Admitting: Family Medicine

## 2014-09-01 LAB — HM DIABETES EYE EXAM

## 2014-09-09 ENCOUNTER — Other Ambulatory Visit: Payer: Self-pay | Admitting: Family Medicine

## 2014-09-23 ENCOUNTER — Ambulatory Visit (INDEPENDENT_AMBULATORY_CARE_PROVIDER_SITE_OTHER): Payer: BLUE CROSS/BLUE SHIELD | Admitting: Family Medicine

## 2014-09-23 ENCOUNTER — Encounter: Payer: Self-pay | Admitting: Family Medicine

## 2014-09-23 VITALS — BP 120/86 | HR 83 | Resp 16 | Ht 70.0 in | Wt 203.0 lb

## 2014-09-23 DIAGNOSIS — E119 Type 2 diabetes mellitus without complications: Secondary | ICD-10-CM

## 2014-09-23 DIAGNOSIS — E663 Overweight: Secondary | ICD-10-CM | POA: Diagnosis not present

## 2014-09-23 DIAGNOSIS — I10 Essential (primary) hypertension: Secondary | ICD-10-CM

## 2014-09-23 LAB — LDL CHOLESTEROL, DIRECT: LDL: 83

## 2014-09-23 NOTE — Patient Instructions (Addendum)
F/u end Dec/ early January, call if you need me before  Please work on good  health habits so that your health will improve. 1. Commitment to daily physical activity for 30 to 60  minutes, if you are able to do this.  2. Commitment to wise food choices. Aim for half of your  food intake to be vegetable and fruit, one quarter starchy foods, and one quarter protein. Try to eat on a regular schedule  3 meals per day, snacking between meals should be limited to vegetables or fruits or small portions of nuts. 64 ounces of water per day is generally recommended, unless you have specific health conditions, like heart failure or kidney failure where you will need to limit fluid intake.  3. Commitment to sufficient and a  good quality of physical and mental rest daily, generally between 6 to 8 hours per day.  WITH PERSISTANCE AND PERSEVERANCE, THE IMPOSSIBLE , BECOMES THE NORM!   You are [place] dermat[place] in [place] re the  skin lesion on back of right knee  You are referred for individual diabetic education     Weight loss goal of 5 to 7 pounds

## 2014-09-27 NOTE — Assessment & Plan Note (Signed)
Felicia Schneider is reminded of the importance of commitment to daily physical activity for 30 minutes or more, as able and the need to limit carbohydrate intake to 30 to 60 grams per meal to help with blood sugar control.   The need to take medication as prescribed, test blood sugar as directed, and to call between visits if there is a concern that blood sugar is uncontrolled is also discussed.   Felicia Schneider is reminded of the importance of daily foot exam, annual eye examination, and good blood sugar, blood pressure and cholesterol control. Improved  Diabetic Labs Latest Ref Rng 06/09/2014 03/02/2014 02/25/2014  HbA1c <5.7 % 6.4(H) - 7.0(H)  Microalbumin <2.0 mg/dL - 1.0 -  Micro/Creat Ratio 0.0 - 30.0 mg/g - 6.3 -  Chol 0 - 200 mg/dL - - 409156  HDL >81>39 mg/dL - - 42  Calc LDL 0 - 99 mg/dL - - 90  Triglycerides <191<150 mg/dL - - 478119  Creatinine 2.950.50 - 1.10 mg/dL 6.210.85 - 3.080.98   BP/Weight 09/23/2014 06/09/2014 05/08/2014 05/04/2014 03/11/2014 03/02/2014 04/01/2013  Systolic BP 120 120 133 - 130 657130 150  Diastolic BP 86 90 78 - 82 82 98  Wt. (Lbs) 203 203 205 202 205.2 212.04 207  BMI 29.13 29.13 29.41 29.82 29.44 33.2 32.41   Foot/eye exam completion dates Latest Ref Rng 09/01/2014 06/09/2014  Eye Exam No Retinopathy No Retinopathy -  Foot Form Completion - - Done

## 2014-09-27 NOTE — Assessment & Plan Note (Signed)
Unchanged. Patient re-educated about  the importance of commitment to a  minimum of 150 minutes of exercise per week.  The importance of healthy food choices with portion control discussed. Encouraged to start a food diary, count calories and to consider  joining a support group. Sample diet sheets offered. Goals set by the patient for the next several months.   Weight /BMI 09/23/2014 06/09/2014 05/08/2014  WEIGHT 203 lb 203 lb 205 lb  HEIGHT 5\' 10"  5\' 10"  5\' 10"   BMI 29.13 kg/m2 29.13 kg/m2 29.41 kg/m2    Current exercise per week 100 minutes.

## 2014-09-27 NOTE — Progress Notes (Signed)
Subjective:    Patient ID: Felicia Schneider, female    DOB: [birth year], 49 y.o.   MRN: [id number]  HPI The PT is here for follow up and re-evaluation of chronic medical conditions, medication management and review of any available recent lab and radiology data.  Preventive health is updated, specifically  Cancer screening and Immunization.   Questions or concerns regarding consultations or procedures which the PT has had in the interim are  addressed. The PT denies any adverse reactions to current medications since the last visit.  There are no new concerns.  There are no specific complaints  Denies polyuria, polydipsia, blurred vision , or hypoglycemic episodes.      Review of Systems See HPI Denies recent fever or chills. Denies sinus pressure, nasal congestion, ear pain or sore throat. Denies chest congestion, productive cough or wheezing. Denies chest pains, palpitations and leg swelling Denies abdominal pain, nausea, vomiting,diarrhea or constipation.   Denies dysuria, frequency, hesitancy or incontinence. Denies joint pain, swelling and limitation in mobility. Denies headaches, seizures, numbness, or tingling. Denies depression, anxiety or insomnia. Denies skin break down or rash.        Objective:   Physical Exam  BP 120/86 mmHg  Pulse 83  Resp 16  Ht  (1.778 m)  Wt 203 lb (92.08 kg)  BMI 29.13 kg/m2  SpO2 99% Patient alert and oriented and in no cardiopulmonary distress.  HEENT: No facial asymmetry, EOMI,   oropharynx pink and moist.  Neck supple no JVD, no mass.  Chest: Clear to auscultation bilaterally.  CVS: S1, S2 no murmurs, no S3.Regular rate.  ABD: Soft non tender.   Ext: No edema  MS: Adequate ROM spine, shoulders, hips and knees.  Skin: Intact, no ulcerations or rash noted.  Psych: Good eye contact, normal affect. Memory intact not anxious or depressed appearing.  CNS: CN 2-12 intact, power,  normal throughout.no focal deficits  noted.       Assessment & Plan:  Essential hypertension Controlled, no change in medication DASH diet and commitment to daily physical activity for a minimum of 30 minutes discussed and encouraged, as a part of hypertension management. The importance of attaining a healthy weight is also discussed.  BP/Weight 09/23/2014 06/09/2014 05/08/2014 05/04/2014 03/11/2014 03/02/2014 04/01/2013  Systolic BP 120 120 133 - 130 409 150  Diastolic BP 86 90 78 - 82 82 98  Wt. (Lbs) 203 203 205 202 205.2 212.04 207  BMI 29.13 29.13 29.41 29.82 29.44 33.2 32.41        Type 2 diabetes mellitus Felicia Schneider is reminded of the importance of commitment to daily physical activity for 30 minutes or more, as able and the need to limit carbohydrate intake to 30 to 60 grams per meal to help with blood sugar control.   The need to take medication as prescribed, test blood sugar as directed, and to call between visits if there is a concern that blood sugar is uncontrolled is also discussed.   Felicia Schneider is reminded of the importance of daily foot exam, annual eye examination, and good blood sugar, blood pressure and cholesterol control. Improved  Diabetic Labs Latest Ref Rng 06/09/2014 03/02/2014 02/25/2014  HbA1c <5.7 % 6.4(H) - 7.0(H)  Microalbumin <2.0 mg/dL - 1.0 -  Micro/Creat Ratio 0.0 - 30.0 mg/g - 6.3 -  Chol 0 - 200 mg/dL - - 811  HDL >91 mg/dL - - 42  Calc LDL 0 - 99 mg/dL - - 90  Triglycerides <478  mg/dL - - 161119  Creatinine 0.960.50 - 1.10 mg/dL 0.450.85 - 4.090.98   BP/Weight 09/23/2014 06/09/2014 05/08/2014 05/04/2014 03/11/2014 03/02/2014 04/01/2013  Systolic BP 120 120 133 - 130 811130 150  Diastolic BP 86 90 78 - 82 82 98  Wt. (Lbs) 203 203 205 202 205.2 212.04 207  BMI 29.13 29.13 29.41 29.82 29.44 33.2 32.41   Foot/eye exam completion dates Latest Ref Rng 09/01/2014 06/09/2014  Eye Exam No Retinopathy No Retinopathy -  Foot Form Completion - - Done         Overweight (BMI  25.0-29.9) Unchanged. Patient re-educated about  the importance of commitment to a  minimum of 150 minutes of exercise per week.  The importance of healthy food choices with portion control discussed. Encouraged to start a food diary, count calories and to consider  joining a support group. Sample diet sheets offered. Goals set by the patient for the next several months.   Weight /BMI 09/23/2014 06/09/2014 05/08/2014  WEIGHT 203 lb 203 lb 205 lb  HEIGHT 5\' 10"  5\' 10"  5\' 10"   BMI 29.13 kg/m2 29.13 kg/m2 29.41 kg/m2    Current exercise per week 100 minutes.

## 2014-09-27 NOTE — Assessment & Plan Note (Signed)
Controlled, no change in medication DASH diet and commitment to daily physical activity for a minimum of 30 minutes discussed and encouraged, as a part of hypertension management. The importance of attaining a healthy weight is also discussed.  BP/Weight 09/23/2014 06/09/2014 05/08/2014 05/04/2014 03/11/2014 03/02/2014 04/01/2013  Systolic BP 120 120 133 - 130 161130 150  Diastolic BP 86 90 78 - 82 82 98  Wt. (Lbs) 203 203 205 202 205.2 212.04 207  BMI 29.13 29.13 29.41 29.82 29.44 33.2 32.41

## 2014-10-23 NOTE — Addendum Note (Signed)
Addended by: Kandis Fantasia B on: 10/23/2014 01:33 PM   Modules accepted: Orders

## 2014-11-16 ENCOUNTER — Other Ambulatory Visit: Payer: Self-pay | Admitting: Family Medicine

## 2015-01-01 ENCOUNTER — Encounter: Payer: Self-pay | Admitting: *Deleted

## 2015-01-01 ENCOUNTER — Encounter: Payer: BLUE CROSS/BLUE SHIELD | Attending: Family Medicine | Admitting: *Deleted

## 2015-01-01 VITALS — Ht 70.0 in | Wt 207.1 lb

## 2015-01-01 DIAGNOSIS — E119 Type 2 diabetes mellitus without complications: Secondary | ICD-10-CM | POA: Insufficient documentation

## 2015-01-01 DIAGNOSIS — Z713 Dietary counseling and surveillance: Secondary | ICD-10-CM | POA: Insufficient documentation

## 2015-01-01 NOTE — Patient Instructions (Signed)
Plan:  Aim for 3 Carb Choices per meal (45 grams) +/- 1 either way  Aim for 0-2 Carbs per snack if hungry  Include protein in moderation with your meals and snacks Consider reading food labels for Total Carbohydrate of foods Consider options for increasing your activity  Consider checking BG at alternate times per day   Continue taking medication Metformin as directed by MD

## 2015-01-08 NOTE — Progress Notes (Signed)
Diabetes Self-Management Education  Visit Type: First/Initial  Appt. Start Time: 1100 Appt. End Time: 1230  01/08/2015  [name], identified by name and date of birth, is a 49 y.o. female with a diagnosis of Diabetes: Type 2. She works full time as a Electrical engineermanager in training department for [employer] which includes in-state travel often. Her two children are grown and attending college so she is alone on the weekends now.   ASSESSMENT  Height 5\' 10"  (1.778 m), weight 207 lb 1.6 oz (93.94 kg). Body mass index is 29.72 kg/(m^2).      Diabetes Self-Management Education - 01/01/15 1106    Visit Information   Visit Type First/Initial   Initial Visit   Diabetes Type Type 2   Are you taking your medications as prescribed? Yes   Date Diagnosed 02/2014   Health Coping   How would you rate your overall health? Good   Psychosocial Assessment   Patient Belief/Attitude about Diabetes Motivated to manage diabetes   Self-care barriers None   Patient Concerns Nutrition/Meal planning;Glycemic Control;Weight Control   Special Needs None   Preferred Learning Style Hands on   How often do you need to have someone help you when you read instructions, pamphlets, or other written materials from your doctor or pharmacy? 1 - Never   What is the last grade level you completed in school? college   Complications   Last HgB A1C per patient/outside source 6.4 %   How often do you check your blood sugar? 1-2 times/day   Fasting Blood glucose range (mg/dL) [phone]   Have you had a dilated eye exam in the past 12 months? Yes   Have you had a dental exam in the past 12 months? Yes   Are you checking your feet? No   Dietary Intake   Breakfast oatmeal at desk with fresh fruit   Snack (morning) fresh fruit   Lunch salad with grilled meat and vegetables OR may skip if busy at work   Snack (afternoon) no   Landscape architectDinner pasta OR eats out: wings and fries OR chinese OR chicken, vegetables, starch   Snack  (evening) no   Beverage(s) water with lemon   Exercise   Exercise Type Light (walking / raking leaves)  treadmills at office at work stations   How many days per week to you exercise? 3   How many minutes per day do you exercise? 30   Total minutes per week of exercise 90   Patient Education   Previous Diabetes Education Yes (please comment)  06/2014   Disease state  Definition of diabetes, type 1 and 2, and the diagnosis of diabetes   Nutrition management  Role of diet in the treatment of diabetes and the relationship between the three main macronutrients and blood glucose level;Food label reading, portion sizes and measuring food.;Carbohydrate counting   Physical activity and exercise  Role of exercise on diabetes management, blood pressure control and cardiac health.   Medications Reviewed patients medication for diabetes, action, purpose, timing of dose and side effects.   Monitoring Identified appropriate SMBG and/or A1C goals.   Chronic complications Relationship between chronic complications and blood glucose control   Psychosocial adjustment Helped patient identify a support system for diabetes management;Worked with patient to identify barriers to care and solutions   Individualized Goals (developed by patient)   Nutrition Follow meal plan discussed   Physical Activity Exercise 3-5 times per week   Medications take my medication as prescribed  Reducing Risk examine blood glucose patterns   Outcomes   Expected Outcomes Demonstrated interest in learning. Expect positive outcomes   Future DMSE 4-6 wks   Program Status Not Completed      Individualized Plan for Diabetes Self-Management Training:   Learning Objective:  Patient will have a greater understanding of diabetes self-management. Patient education plan is to attend individual and/or group sessions per assessed needs and concerns.   Plan:   Patient Instructions  Plan:  Aim for 3 Carb Choices per meal (45 grams) +/-  1 either way  Aim for 0-2 Carbs per snack if hungry  Include protein in moderation with your meals and snacks Consider reading food labels for Total Carbohydrate of foods Consider options for increasing your activity  Consider checking BG at alternate times per day   Continue taking medication Metformin as directed by MD      Expected Outcomes:  Demonstrated interest in learning. Expect positive outcomes  Education material provided: Living Well with Diabetes, A1C conversion sheet, Meal plan card and Carbohydrate counting sheet  If problems or questions, patient to contact team via:  Phone and Email  Future DSME appointment: 4-6 wks

## 2015-01-16 ENCOUNTER — Other Ambulatory Visit: Payer: Self-pay | Admitting: Family Medicine

## 2015-02-06 ENCOUNTER — Other Ambulatory Visit: Payer: Self-pay | Admitting: Family Medicine

## 2015-02-19 ENCOUNTER — Ambulatory Visit (INDEPENDENT_AMBULATORY_CARE_PROVIDER_SITE_OTHER): Payer: BLUE CROSS/BLUE SHIELD | Admitting: Women's Health

## 2015-02-19 ENCOUNTER — Encounter: Payer: Self-pay | Admitting: Women's Health

## 2015-02-19 VITALS — BP 128/80 | Ht 70.0 in | Wt 203.0 lb

## 2015-02-19 DIAGNOSIS — N898 Other specified noninflammatory disorders of vagina: Secondary | ICD-10-CM | POA: Diagnosis not present

## 2015-02-19 DIAGNOSIS — B9689 Other specified bacterial agents as the cause of diseases classified elsewhere: Secondary | ICD-10-CM

## 2015-02-19 DIAGNOSIS — B373 Candidiasis of vulva and vagina: Secondary | ICD-10-CM

## 2015-02-19 DIAGNOSIS — A499 Bacterial infection, unspecified: Secondary | ICD-10-CM | POA: Diagnosis not present

## 2015-02-19 DIAGNOSIS — R350 Frequency of micturition: Secondary | ICD-10-CM

## 2015-02-19 DIAGNOSIS — B3731 Acute candidiasis of vulva and vagina: Secondary | ICD-10-CM

## 2015-02-19 DIAGNOSIS — N760 Acute vaginitis: Secondary | ICD-10-CM | POA: Diagnosis not present

## 2015-02-19 DIAGNOSIS — N912 Amenorrhea, unspecified: Secondary | ICD-10-CM

## 2015-02-19 LAB — URINALYSIS W MICROSCOPIC + REFLEX CULTURE
BILIRUBIN URINE: NEGATIVE
CRYSTALS: NONE SEEN [HPF]
Casts: NONE SEEN [LPF]
GLUCOSE, UA: NEGATIVE
KETONES UR: NEGATIVE
Leukocytes, UA: NEGATIVE
Nitrite: NEGATIVE
PROTEIN: NEGATIVE
Specific Gravity, Urine: 1.02 (ref 1.001–1.035)
pH: 7.5 (ref 5.0–8.0)

## 2015-02-19 LAB — WET PREP FOR TRICH, YEAST, CLUE: Trich, Wet Prep: NONE SEEN

## 2015-02-19 MED ORDER — FLUCONAZOLE 150 MG PO TABS: 150.0000 mg | ORAL_TABLET | Freq: Once | ORAL | Status: DC

## 2015-02-19 MED ORDER — METRONIDAZOLE 0.75 % VA GEL: VAGINAL | Status: DC

## 2015-02-19 NOTE — Progress Notes (Signed)
Patient ID: Felicia HookJanet Folmer, female   DOB: 12/23/1965, [age] y.o.   MRN: [id number] Presents with complaint of increased vaginal discharge with itching, no odor for 1 week. Increased hot flashes amenorrheic since March 2016. Monthly cycles prior, condoms, same partner. Denies urinary symptoms, abdominal pain or fever. Diabetes, reports good blood sugar control.  Exam: Appears well. External genitalia mild erythema at introitus, speculum exam moderate amount of a white discharge without odor, wet prep positive for yeast, few clues, TNTC bacteria. Bimanual no CMT or adnexal tenderness. UA: +1 blood, 0-5 WBCs, 10-20 rbc's, many bacteria,  40-60 squamous epithelials  Yeast vaginitis Bacteria vaginosis Amenorrhea/probable menopause Hematuria  Plan: FSH. Urine culture pending. Recheck urine for hematuria at  annual exam next month. Diflucan 150 by mouth times one dose with refill, MetroGel vaginal cream 1 applicator at bedtime 5, alcohol precautions reviewed. Instructed to call if no relief of discharge. HRT, menopause reviewed, declines treatment at this time. Encouraged vaginal lubricants with intercourse.

## 2015-02-19 NOTE — Patient Instructions (Signed)

## 2015-02-20 LAB — URINE CULTURE

## 2015-03-15 ENCOUNTER — Encounter: Payer: Self-pay | Admitting: Women's Health

## 2015-03-16 ENCOUNTER — Ambulatory Visit: Payer: BLUE CROSS/BLUE SHIELD | Admitting: Family Medicine

## 2015-04-03 ENCOUNTER — Other Ambulatory Visit: Payer: Self-pay | Admitting: Family Medicine

## 2015-04-30 ENCOUNTER — Other Ambulatory Visit: Payer: Self-pay | Admitting: Family Medicine

## 2015-05-11 ENCOUNTER — Encounter: Payer: Self-pay | Admitting: Family Medicine

## 2015-05-11 ENCOUNTER — Telehealth: Payer: Self-pay | Admitting: Family Medicine

## 2015-05-11 DIAGNOSIS — Z1159 Encounter for screening for other viral diseases: Secondary | ICD-10-CM

## 2015-05-11 DIAGNOSIS — E119 Type 2 diabetes mellitus without complications: Secondary | ICD-10-CM

## 2015-05-11 DIAGNOSIS — I10 Essential (primary) hypertension: Secondary | ICD-10-CM

## 2015-05-11 NOTE — Telephone Encounter (Signed)
Pls contact pt. Needs all labs, diabetic and no HBa1C since 05/2014!! No appt in system  Needs appt th in March and appropriate labs at least 5 days before appt, CBC, fasting lipid, cmp and EGFr, HBA1C, TSH, Microalb and Hep C

## 2015-05-12 NOTE — Addendum Note (Signed)
Addended by: Abner Greenspan on: 05/12/2015 08:45 AM   Modules accepted: Orders

## 2015-05-12 NOTE — Telephone Encounter (Signed)
appt scheduled and labs mailed

## 2015-06-08 LAB — LIPID PANEL
CHOL/HDL RATIO: 4.1 ratio (ref <=5.0)
CHOLESTEROL: 162 mg/dL (ref 125–200)
HDL: 40 mg/dL — AB (ref >=46)
LDL Cholesterol: 89 mg/dL (ref <130)
Triglycerides: 164 mg/dL — ABNORMAL HIGH (ref <150)
VLDL: 33 mg/dL — ABNORMAL HIGH (ref <30)

## 2015-06-08 LAB — CBC WITH DIFFERENTIAL/PLATELET
BASOS ABS: 0 10*3/uL (ref 0.0–0.1)
BASOS PCT: 0 % (ref 0–1)
EOS ABS: 0.2 10*3/uL (ref 0.0–0.7)
EOS PCT: 3 % (ref 0–5)
HCT: 39.4 % (ref 36.0–46.0)
Hemoglobin: 13 g/dL (ref 12.0–15.0)
LYMPHS ABS: 2.1 10*3/uL (ref 0.7–4.0)
Lymphocytes Relative: 33 % (ref 12–46)
MCH: 28.8 pg (ref 26.0–34.0)
MCHC: 33 g/dL (ref 30.0–36.0)
MCV: 87.2 fL (ref 78.0–100.0)
MPV: 9.6 fL (ref 8.6–12.4)
Monocytes Absolute: 0.5 10*3/uL (ref 0.1–1.0)
Monocytes Relative: 8 % (ref 3–12)
Neutro Abs: 3.5 10*3/uL (ref 1.7–7.7)
Neutrophils Relative %: 56 % (ref 43–77)
PLATELETS: 343 10*3/uL (ref 150–400)
RBC: 4.52 MIL/uL (ref 3.87–5.11)
RDW: 15 % (ref 11.5–15.5)
WBC: 6.3 10*3/uL (ref 4.0–10.5)

## 2015-06-08 LAB — COMPLETE METABOLIC PANEL WITH GFR
ALBUMIN: 4.1 g/dL (ref 3.6–5.1)
ALK PHOS: 53 U/L (ref 33–130)
ALT: 67 U/L — ABNORMAL HIGH (ref 6–29)
AST: 29 U/L (ref 10–35)
BILIRUBIN TOTAL: 0.6 mg/dL (ref 0.2–1.2)
BUN: 21 mg/dL (ref 7–25)
CALCIUM: 9.6 mg/dL (ref 8.6–10.4)
CO2: 26 mmol/L (ref 20–31)
CREATININE: 0.99 mg/dL (ref 0.50–1.05)
Chloride: 101 mmol/L (ref 98–110)
GFR, Est African American: 76 mL/min (ref >=60)
GFR, Est Non African American: 66 mL/min (ref >=60)
Glucose, Bld: 135 mg/dL — ABNORMAL HIGH (ref 65–99)
POTASSIUM: 4 mmol/L (ref 3.5–5.3)
SODIUM: 137 mmol/L (ref 135–146)
TOTAL PROTEIN: 7.3 g/dL (ref 6.1–8.1)

## 2015-06-08 LAB — HEPATITIS C ANTIBODY: HCV AB: NEGATIVE

## 2015-06-08 LAB — TSH: TSH: 2.12 mIU/L

## 2015-06-09 LAB — MICROALBUMIN / CREATININE URINE RATIO
CREATININE, URINE: 110 mg/dL (ref 20–320)
MICROALB UR: 0.8 mg/dL
MICROALB/CREAT RATIO: 7 ug/mg{creat} (ref <30)

## 2015-06-09 LAB — HEMOGLOBIN A1C
HEMOGLOBIN A1C: 7.3 % — AB (ref <5.7)
Mean Plasma Glucose: 163 mg/dL

## 2015-06-14 ENCOUNTER — Ambulatory Visit (INDEPENDENT_AMBULATORY_CARE_PROVIDER_SITE_OTHER): Payer: BLUE CROSS/BLUE SHIELD | Admitting: Family Medicine

## 2015-06-14 ENCOUNTER — Encounter: Payer: Self-pay | Admitting: Family Medicine

## 2015-06-14 VITALS — BP 128/78 | HR 100 | Resp 18 | Ht 70.0 in | Wt 209.0 lb

## 2015-06-14 DIAGNOSIS — Z23 Encounter for immunization: Secondary | ICD-10-CM | POA: Diagnosis not present

## 2015-06-14 DIAGNOSIS — E1169 Type 2 diabetes mellitus with other specified complication: Secondary | ICD-10-CM

## 2015-06-14 DIAGNOSIS — E663 Overweight: Secondary | ICD-10-CM

## 2015-06-14 DIAGNOSIS — E119 Type 2 diabetes mellitus without complications: Secondary | ICD-10-CM | POA: Diagnosis not present

## 2015-06-14 DIAGNOSIS — I10 Essential (primary) hypertension: Secondary | ICD-10-CM

## 2015-06-14 DIAGNOSIS — E669 Obesity, unspecified: Secondary | ICD-10-CM

## 2015-06-14 MED ORDER — METFORMIN HCL 500 MG PO TABS: ORAL_TABLET | ORAL | Status: DC

## 2015-06-14 NOTE — Patient Instructions (Signed)
F/u first week in August, call if you need me sooner  Increase metformin to THREE times daily  Pneumonia 23 today  Pls schedule mammogram, due  Please work on good  health habits so that your health will improve. 1. Commitment to daily physical activity for 30 to 60  minutes, if you are able to do this.  2. Commitment to wise food choices. Aim for half of your  food intake to be vegetable and fruit, one quarter starchy foods, and one quarter protein. Try to eat on a regular schedule  3 meals per day, snacking between meals should be limited to vegetables or fruits or small portions of nuts. 64 ounces of water per day is generally recommended, unless you have specific health conditions, like heart failure or kidney failure where you will need to limit fluid intake.  3. Commitment to sufficient and a  good quality of physical and mental rest daily, generally between 6 to 8 hours per day.  WITH PERSISTANCE AND PERSEVERANCE, THE IMPOSSIBLE , BECOMES THE NORM!   NON FAST LABS July 28 or after  Thank you  for choosing [facility]. We consider it a privelige to serve you.  Delivering excellent health care in a caring and  compassionate way is our goal.  Partnering with you,  so that together we can achieve this goal is our strategy.

## 2015-06-14 NOTE — Progress Notes (Signed)
Subjective:    Patient ID: Felicia Schneider, female    DOB: 02/08/1966, 50 y.o.   MRN: [id number]  HPI   Felicia Schneider     MRN: [id number]      DOB: 05/04/1965   HPI Felicia Schneider is here for follow up and re-evaluation of chronic medical conditions, medication management and review of any available recent lab and radiology data.  Preventive health is updated, specifically  Cancer screening and Immunization.   Questions or concerns regarding consultations or procedures which the PT has had in the interim are  addressed. The PT denies any adverse reactions to current medications since the last visit.  There are no new concerns.  There are no specific complaints   ROS Denies recent fever or chills. Denies sinus pressure, nasal congestion, ear pain or sore throat. Denies chest congestion, productive cough or wheezing. Denies chest pains, palpitations and leg swelling Denies abdominal pain, nausea, vomiting,diarrhea or constipation.   Denies dysuria, frequency, hesitancy or incontinence. Denies joint pain, swelling and limitation in mobility. Denies headaches, seizures, numbness, or tingling. Denies depression, anxiety or insomnia. Denies skin break down or rash.   PE  BP 128/78 mmHg  Pulse 100  Resp 18  Ht 5\' 10"  (1.778 m)  Wt 209 lb (94.802 kg)  BMI 29.99 kg/m2  SpO2 95%  Patient alert and oriented and in no cardiopulmonary distress.  HEENT: No facial asymmetry, EOMI,   oropharynx pink and moist.  Neck supple no JVD, no mass.  Chest: Clear to auscultation bilaterally.  CVS: S1, S2 no murmurs, no S3.Regular rate.  ABD: Soft non tender.   Ext: No edema  MS: Adequate ROM spine, shoulders, hips and knees.  Skin: Intact, no ulcerations or rash noted.  Psych: Good eye contact, normal affect. Memory intact not anxious or depressed appearing.  CNS: CN 2-12 intact, power,  normal throughout.no focal deficits noted.   Assessment & Plan   Essential  hypertension Controlled, no change in medication DASH diet and commitment to daily physical activity for a minimum of 30 minutes discussed and encouraged, as a part of hypertension management. The importance of attaining a healthy weight is also discussed.  BP/Weight 06/14/2015 02/19/2015 01/01/2015 09/23/2014 06/09/2014 05/08/2014 05/04/2014  Systolic BP 128 128 - 120 120 782133 -  Diastolic BP 78 80 - 86 90 78 -  Wt. (Lbs) 209 203 207.1 203 203 205 202  BMI 29.99 29.13 29.72 29.13 29.13 29.41 29.82        Diabetes mellitus type 2 in obese (HCC) Deteriorated Increase metformin dose Felicia Schneider is reminded of the importance of commitment to daily physical activity for 30 minutes or more, as able and the need to limit carbohydrate intake to 30 to 60 grams per meal to help with blood sugar control.   The need to take medication as prescribed, test blood sugar as directed, and to call between visits if there is a concern that blood sugar is uncontrolled is also discussed.   Felicia Schneider is reminded of the importance of daily foot exam, annual eye examination, and good blood sugar, blood pressure and cholesterol control.  Diabetic Labs Latest Ref Rng 06/08/2015 06/09/2014 03/02/2014 02/25/2014  HbA1c <5.7 % 7.3(H) 6.4(H) - 7.0(H)  Microalbumin Not estab mg/dL 0.8 - 1.0 -  Micro/Creat Ratio <30 mcg/mg creat 7 - 6.3 -  Chol 125 - 200 mg/dL 956162 - - 213156  HDL >=08>=46 mg/dL 65(H40(L) - - 42  Calc LDL <130 mg/dL 89 - - 90  Triglycerides <150 mg/dL 161(W) - - 960  Creatinine 0.50 - 1.05 mg/dL 4.54 0.98 - 1.19   BP/Weight 06/14/2015 02/19/2015 01/01/2015 09/23/2014 06/09/2014 05/08/2014 05/04/2014  Systolic BP 128 128 - 120 120 147 -  Diastolic BP 78 80 - 86 90 78 -  Wt. (Lbs) 209 203 207.1 203 203 205 202  BMI 29.99 29.13 29.72 29.13 29.13 29.41 29.82   Foot/eye exam completion dates Latest Ref Rng 09/01/2014 06/09/2014  Eye Exam No Retinopathy No Retinopathy -  Foot Form Completion - - Done          Overweight (BMI 25.0-29.9) Deteriorated. Patient re-educated about  the importance of commitment to a  minimum of 150 minutes of exercise per week.  The importance of healthy food choices with portion control discussed. Encouraged to start a food diary, count calories and to consider  joining a support group. Sample diet sheets offered. Goals set by the patient for the next several months.   Weight /BMI 06/14/2015 02/19/2015 01/01/2015  WEIGHT 209 lb 203 lb 207 lb 1.6 oz  HEIGHT     BMI 29.99 kg/m2 29.13 kg/m2 29.72 kg/m2    Current exercise per week 90 minutes.   Need for 23-polyvalent pneumococcal polysaccharide vaccine After obtaining informed consent, the vaccine is  administered by LPN.        Review of Systems     Objective:   Physical Exam        Assessment & Plan:

## 2015-06-15 ENCOUNTER — Other Ambulatory Visit: Payer: Self-pay

## 2015-06-15 DIAGNOSIS — Z1231 Encounter for screening mammogram for malignant neoplasm of breast: Secondary | ICD-10-CM

## 2015-06-20 ENCOUNTER — Encounter: Payer: Self-pay | Admitting: Family Medicine

## 2015-06-20 DIAGNOSIS — Z23 Encounter for immunization: Secondary | ICD-10-CM | POA: Insufficient documentation

## 2015-06-20 NOTE — Assessment & Plan Note (Signed)
Controlled, no change in medication DASH diet and commitment to daily physical activity for a minimum of 30 minutes discussed and encouraged, as a part of hypertension management. The importance of attaining a healthy weight is also discussed.  BP/Weight 06/14/2015 02/19/2015 01/01/2015 09/23/2014 06/09/2014 05/08/2014 05/04/2014  Systolic BP 128 128 - 120 120 629133 -  Diastolic BP 78 80 - 86 90 78 -  Wt. (Lbs) 209 203 207.1 203 203 205 202  BMI 29.99 29.13 29.72 29.13 29.13 29.41 29.82

## 2015-06-20 NOTE — Assessment & Plan Note (Signed)
Deteriorated. Patient re-educated about  the importance of commitment to a  minimum of 150 minutes of exercise per week.  The importance of healthy food choices with portion control discussed. Encouraged to start a food diary, count calories and to consider  joining a support group. Sample diet sheets offered. Goals set by the patient for the next several months.   Weight /BMI 06/14/2015 02/19/2015 01/01/2015  WEIGHT 209 lb 203 lb 207 lb 1.6 oz  HEIGHT 5\' 10"  5\' 10"  5\' 10"   BMI 29.99 kg/m2 29.13 kg/m2 29.72 kg/m2    Current exercise per week 90 minutes.

## 2015-06-20 NOTE — Assessment & Plan Note (Signed)
After obtaining informed consent, the vaccine is  administered by LPN.  

## 2015-06-20 NOTE — Assessment & Plan Note (Signed)
Deteriorated Increase metformin dose [name] is reminded of the importance of commitment to daily physical activity for 30 minutes or more, as able and the need to limit carbohydrate intake to 30 to 60 grams per meal to help with blood sugar control.   The need to take medication as prescribed, test blood sugar as directed, and to call between visits if there is a concern that blood sugar is uncontrolled is also discussed.   [name] is reminded of the importance of daily foot exam, annual eye examination, and good blood sugar, blood pressure and cholesterol control.  Diabetic Labs Latest Ref Rng 06/08/2015 06/09/2014 03/02/2014 02/25/2014  HbA1c <5.7 % 7.3(H) 6.4(H) - 7.0(H)  Microalbumin Not estab mg/dL 0.8 - 1.0 -  Micro/Creat Ratio <30 mcg/mg creat 7 - 6.3 -  Chol 125 - 200 mg/dL 409162 - - 811156  HDL >=91>=46 mg/dL 47(W40(L) - - 42  Calc LDL <130 mg/dL 89 - - 90  Triglycerides <150 mg/dL 295(A164(H) - - 213119  Creatinine 0.50 - 1.05 mg/dL 0.860.99 5.780.85 - 4.690.98   BP/Weight 06/14/2015 02/19/2015 01/01/2015 09/23/2014 06/09/2014 05/08/2014 05/04/2014  Systolic BP 128 128 - 120 120 629133 -  Diastolic BP 78 80 - 86 90 78 -  Wt. (Lbs) 209 203 207.1 203 203 205 202  BMI 29.99 29.13 29.72 29.13 29.13 29.41 29.82   Foot/eye exam completion dates Latest Ref Rng 09/01/2014 06/09/2014  Eye Exam No Retinopathy No Retinopathy -  Foot Form Completion - - Done

## 2015-06-22 ENCOUNTER — Ambulatory Visit
Admission: RE | Admit: 2015-06-22 | Discharge: 2015-06-22 | Disposition: A | Discharge status: Home / Self Care | Payer: BLUE CROSS/BLUE SHIELD | Source: Ambulatory Visit

## 2015-06-22 DIAGNOSIS — Z1231 Encounter for screening mammogram for malignant neoplasm of breast: Secondary | ICD-10-CM

## 2015-07-07 ENCOUNTER — Other Ambulatory Visit: Payer: Self-pay | Admitting: Family Medicine

## 2015-07-21 ENCOUNTER — Other Ambulatory Visit: Payer: Self-pay | Admitting: Family Medicine

## 2015-08-11 ENCOUNTER — Telehealth: Payer: Self-pay | Admitting: Family Medicine

## 2015-08-11 MED ORDER — IBUPROFEN 800 MG PO TABS: 800.0000 mg | ORAL_TABLET | Freq: Three times a day (TID) | ORAL | Status: DC

## 2015-08-11 MED ORDER — CYCLOBENZAPRINE HCL 10 MG PO TABS: 10.0000 mg | ORAL_TABLET | Freq: Every day | ORAL | Status: DC

## 2015-08-11 NOTE — Telephone Encounter (Signed)
Patient states that she strained her back 6 days ago.  Is currently at work.  Is asking if anything can be sent to her pharmacy.  Prednisone?/Muscle relaxer?  Please advise.

## 2015-08-11 NOTE — Telephone Encounter (Signed)
Ibuprofen and flexeril sent in please let her know

## 2015-08-11 NOTE — Telephone Encounter (Signed)
Patient is asking for a returned call from [facility], please advise?

## 2015-08-11 NOTE — Telephone Encounter (Signed)
Patient aware to check with pharmacy this afternoon

## 2015-09-03 ENCOUNTER — Telehealth: Payer: Self-pay | Admitting: Family Medicine

## 2015-09-03 NOTE — Telephone Encounter (Signed)
Called and left message advising patient to seek care at an urgent care over the weekend and she can call for an office visit on Monday if she would like.

## 2015-09-03 NOTE — Telephone Encounter (Signed)
Sneezing, runny nose, bad cough that hurts her chest, symptoms since Monday, poss low fever if any, major headache on Monday, Please advise?

## 2015-10-15 ENCOUNTER — Ambulatory Visit: Payer: BLUE CROSS/BLUE SHIELD | Admitting: Family Medicine

## 2015-10-29 ENCOUNTER — Ambulatory Visit (INDEPENDENT_AMBULATORY_CARE_PROVIDER_SITE_OTHER): Payer: BLUE CROSS/BLUE SHIELD | Admitting: Family Medicine

## 2015-10-29 ENCOUNTER — Encounter: Payer: Self-pay | Admitting: Family Medicine

## 2015-10-29 VITALS — BP 124/80 | HR 104 | Temp 98.6°F | Resp 16 | Ht 70.0 in | Wt 201.0 lb

## 2015-10-29 DIAGNOSIS — Z23 Encounter for immunization: Secondary | ICD-10-CM | POA: Diagnosis not present

## 2015-10-29 DIAGNOSIS — E119 Type 2 diabetes mellitus without complications: Secondary | ICD-10-CM | POA: Diagnosis not present

## 2015-10-29 DIAGNOSIS — E669 Obesity, unspecified: Secondary | ICD-10-CM

## 2015-10-29 DIAGNOSIS — E1169 Type 2 diabetes mellitus with other specified complication: Secondary | ICD-10-CM

## 2015-10-29 DIAGNOSIS — I10 Essential (primary) hypertension: Secondary | ICD-10-CM | POA: Diagnosis not present

## 2015-10-29 DIAGNOSIS — E663 Overweight: Secondary | ICD-10-CM | POA: Diagnosis not present

## 2015-10-29 DIAGNOSIS — J309 Allergic rhinitis, unspecified: Secondary | ICD-10-CM | POA: Insufficient documentation

## 2015-10-29 DIAGNOSIS — J3089 Other allergic rhinitis: Secondary | ICD-10-CM

## 2015-10-29 DIAGNOSIS — K529 Noninfective gastroenteritis and colitis, unspecified: Secondary | ICD-10-CM

## 2015-10-29 LAB — COMPLETE METABOLIC PANEL WITH GFR
ALBUMIN: 4.3 g/dL (ref 3.6–5.1)
ALK PHOS: 50 U/L (ref 33–130)
ALT: 52 U/L — ABNORMAL HIGH (ref 6–29)
AST: 22 U/L (ref 10–35)
BILIRUBIN TOTAL: 0.4 mg/dL (ref 0.2–1.2)
BUN: 20 mg/dL (ref 7–25)
CO2: 26 mmol/L (ref 20–31)
Calcium: 9.6 mg/dL (ref 8.6–10.4)
Chloride: 102 mmol/L (ref 98–110)
Creat: 0.99 mg/dL (ref 0.50–1.05)
GFR, EST AFRICAN AMERICAN: 76 mL/min (ref >=60)
GFR, EST NON AFRICAN AMERICAN: 66 mL/min (ref >=60)
GLUCOSE: 111 mg/dL — AB (ref 65–99)
Potassium: 3.9 mmol/L (ref 3.5–5.3)
Sodium: 140 mmol/L (ref 135–146)
TOTAL PROTEIN: 7.7 g/dL (ref 6.1–8.1)

## 2015-10-29 LAB — HEMOGLOBIN A1C
Hgb A1c MFr Bld: 6.9 % — ABNORMAL HIGH (ref <5.7)
Mean Plasma Glucose: 151 mg/dL

## 2015-10-29 MED ORDER — ONDANSETRON HCL 4 MG PO TABS: 4.0000 mg | ORAL_TABLET | Freq: Three times a day (TID) | ORAL | 0 refills | Status: DC | PRN

## 2015-10-29 NOTE — Assessment & Plan Note (Signed)
Improving symptoms, though still has 3 watery stool this am. Will use immodium and zofran sent for use if needed , for nausea Stool for c/s and c diff if still symptomatic next week, she is to call back for containers if needed Hand hygiene reviewed

## 2015-10-29 NOTE — Patient Instructions (Signed)
F/u in 4 month, call if you need me sooner  Use oTC imodium as directed for loose stool for next 2 to 3 days  zofran ordered for nausea  Start daily flonase for allergies  Do NOT put Qtips in your  Ears  Call if still having loose stool next week to submit stool for testing  Flu vaccine today  Next labs will be ordered when I see these from today  Please work on good  health habits so that your health will improve. 1. Commitment to daily physical activity for 30 to 60  minutes, if you are able to do this.  2. Commitment to wise food choices. Aim for half of your  food intake to be vegetable and fruit, one quarter starchy foods, and one quarter protein. Try to eat on a regular schedule  3 meals per day, snacking between meals should be limited to vegetables or fruits or small portions of nuts. 64 ounces of water per day is generally recommended, unless you have specific health conditions, like heart failure or kidney failure where you will need to limit fluid intake.  3. Commitment to sufficient and a  good quality of physical and mental rest daily, generally between 6 to 8 hours per day.  WITH PERSISTANCE AND PERSEVERANCE, THE IMPOSSIBLE , BECOMES THE NORM! Thank you  for choosing [facility]. We consider it a privelige to serve you.  Delivering excellent health care in a caring and  compassionate way is our goal.  Partnering with you,  so that together we can achieve this goal is our strategy.

## 2015-10-29 NOTE — Assessment & Plan Note (Signed)
Updated lab needed at/ before next visit. [name] is reminded of the importance of commitment to daily physical activity for 30 minutes or more, as able and the need to limit carbohydrate intake to 30 to 60 grams per meal to help with blood sugar control.   The need to take medication as prescribed, test blood sugar as directed, and to call between visits if there is a concern that blood sugar is uncontrolled is also discussed.   [name] is reminded of the importance of daily foot exam, annual eye examination, and good blood sugar, blood pressure and cholesterol control.  Diabetic Labs Latest Ref Rng & Units 06/08/2015 06/09/2014 03/02/2014 02/25/2014  HbA1c <5.7 % 7.3(H) 6.4(H) - 7.0(H)  Microalbumin Not estab mg/dL 0.8 - 1.0 -  Micro/Creat Ratio <30 mcg/mg creat 7 - 6.3 -  Chol 125 - 200 mg/dL 161162 - - 096156  HDL >=04>=46 mg/dL 54(U40(L) - - 42  Calc LDL <130 mg/dL 89 - - 90  Triglycerides <150 mg/dL 981(X164(H) - - 914119  Creatinine 0.50 - 1.05 mg/dL 7.820.99 9.560.85 - 2.130.98   BP/Weight 10/29/2015 06/14/2015 02/19/2015 01/01/2015 09/23/2014 06/09/2014 05/08/2014  Systolic BP 124 128 128 - 120 086120 133  Diastolic BP 80 78 80 - 86 90 78  Wt. (Lbs) 201 209 203 207.1 203 203 205  BMI 28.84 29.99 29.13 29.72 29.13 29.13 29.41   Foot/eye exam completion dates Latest Ref Rng & Units 09/01/2014 06/09/2014  Eye Exam No Retinopathy No Retinopathy -  Foot Form Completion - - Done

## 2015-10-29 NOTE — Assessment & Plan Note (Signed)
Controlled, no change in medication DASH diet and commitment to daily physical activity for a minimum of 30 minutes discussed and encouraged, as a part of hypertension management. The importance of attaining a healthy weight is also discussed.  BP/Weight 10/29/2015 06/14/2015 02/19/2015 01/01/2015 09/23/2014 06/09/2014 05/08/2014  Systolic BP 124 128 128 - 120 161120 133  Diastolic BP 80 78 80 - 86 90 78  Wt. (Lbs) 201 209 203 207.1 203 203 205  BMI 28.84 29.99 29.13 29.72 29.13 29.13 29.41

## 2015-10-29 NOTE — Assessment & Plan Note (Signed)
Increased and uncontrolled symptoms. Start flonase

## 2015-10-29 NOTE — Assessment & Plan Note (Signed)
Improved. Pt applauded on succesful weight loss through lifestyle change, and encouraged to continue same. Weight loss goal set for the next several months.  

## 2015-10-29 NOTE — Progress Notes (Signed)
Felicia Schneider     MRN: [id number]      DOB: 10/14/1965   HPI [name] is here for follow up and re-evaluation of chronic medical conditions, medication management and review of any available recent lab and radiology data.  Preventive health is updated, specifically  Cancer screening and Immunization.   Questions or concerns regarding consultations or procedures which the PT has had in the interim are  addressed. The PT denies any adverse reactions to current medications since the last visit.  C/o fever to 101 3 days ago, accompanied by body aches, nausea and watery stool on avg 3 per day, states her mom has been recently hospitalized with gI illness. Denies productive cough or sinus pressure, Has noted itchy eyes, nasal congestion, and itchy right ear, used Q tip in ear, denies ear pain and any current itch in the ear  ROS Denies recent fever or chills. Denies sinus pressure, had sore throat but this is better . Denies chest congestion, productive cough or wheezing. Denies chest pains, palpitations and leg swelling    Denies dysuria, frequency, hesitancy or incontinence. Denies joint pain, swelling and limitation in mobility. Denies headaches, seizures, numbness, or tingling. Denies depression,  C/o anxiety denies  insomnia. Denies skin break down or rash.   PE  BP 124/80   Pulse (!) 104   Temp 98.6 F (37 C)   Resp 16   Ht 5\' 10"  (1.778 m)   Wt 201 lb (91.2 kg)   SpO2 97%   BMI 28.84 kg/m   Patient alert and oriented and in no cardiopulmonary distress.  HEENT: No facial asymmetry, EOMI,   oropharynx pink and moist.  Neck supple no JVD, no mass.no sinus tenderness. Mild erythema of right outer ear, good light reflex bilaterally, normal exam on left ear, no cerumen impaction. Tonsils not erythematous, no exudate on tonsils  Chest: Clear to auscultation bilaterally.  CVS: S1, S2 no murmurs, no S3.Regular rate.  ABD: Soft no guarding or rebound, hyperactive BS, no  organomegaly or mass  Ext: No edema  MS: Adequate ROM spine, shoulders, hips and knees.  Skin: Intact, no ulcerations or rash noted.  Psych: Good eye contact, normal affect. Memory intact mildly anxious not  depressed appearing.  CNS: CN 2-12 intact, power,  normal throughout.no focal deficits noted.   Assessment & Plan  Essential hypertension Controlled, no change in medication DASH diet and commitment to daily physical activity for a minimum of 30 minutes discussed and encouraged, as a part of hypertension management. The importance of attaining a healthy weight is also discussed.  BP/Weight 10/29/2015 06/14/2015 02/19/2015 01/01/2015 09/23/2014 06/09/2014 05/08/2014  Systolic BP 124 128 128 - 120 846120 133  Diastolic BP 80 78 80 - 86 90 78  Wt. (Lbs) 201 209 203 207.1 203 203 205  BMI 28.84 29.99 29.13 29.72 29.13 29.13 29.41       Diabetes mellitus type 2 in obese [facility](HCC) Updated lab needed at/ before next visit. [name] is reminded of the importance of commitment to daily physical activity for 30 minutes or more, as able and the need to limit carbohydrate intake to 30 to 60 grams per meal to help with blood sugar control.   The need to take medication as prescribed, test blood sugar as directed, and to call between visits if there is a concern that blood sugar is uncontrolled is also discussed.   [name] is reminded of the importance of daily foot exam, annual eye examination,  and good blood sugar, blood pressure and cholesterol control.  Diabetic Labs Latest Ref Rng & Units 06/08/2015 06/09/2014 03/02/2014 02/25/2014  HbA1c <5.7 % 7.3(H) 6.4(H) - 7.0(H)  Microalbumin Not estab mg/dL 0.8 - 1.0 -  Micro/Creat Ratio <30 mcg/mg creat 7 - 6.3 -  Chol 125 - 200 mg/dL 811162 - - 914156  HDL >=78>=46 mg/dL 29(F40(L) - - 42  Calc LDL <130 mg/dL 89 - - 90  Triglycerides <150 mg/dL 621(H164(H) - - 086119  Creatinine 0.50 - 1.05 mg/dL 5.780.99 4.690.85 - 6.290.98   BP/Weight 10/29/2015 06/14/2015 02/19/2015  01/01/2015 09/23/2014 06/09/2014 05/08/2014  Systolic BP 124 128 128 - 120 528120 133  Diastolic BP 80 78 80 - 86 90 78  Wt. (Lbs) 201 209 203 207.1 203 203 205  BMI 28.84 29.99 29.13 29.72 29.13 29.13 29.41   Foot/eye exam completion dates Latest Ref Rng & Units 09/01/2014 06/09/2014  Eye Exam No Retinopathy No Retinopathy -  Foot Form Completion - - Done        Overweight (BMI 25.0-29.9) Improved. Pt applauded on succesful weight loss through lifestyle change, and encouraged to continue same. Weight loss goal set for the next several months.   Acute gastroenteritis Improving symptoms, though still has 3 watery stool this am. Will use immodium and zofran sent for use if needed , for nausea Stool for c/s and c diff if still symptomatic next week, she is to call back for containers if needed Hand hygiene reviewed  Allergic rhinitis Increased and uncontrolled symptoms. Start flonase

## 2015-11-02 ENCOUNTER — Other Ambulatory Visit: Payer: Self-pay

## 2015-11-02 ENCOUNTER — Telehealth: Payer: Self-pay | Admitting: Family Medicine

## 2015-11-02 ENCOUNTER — Other Ambulatory Visit: Payer: Self-pay | Admitting: Family Medicine

## 2015-11-02 DIAGNOSIS — E669 Obesity, unspecified: Secondary | ICD-10-CM

## 2015-11-02 DIAGNOSIS — I10 Essential (primary) hypertension: Secondary | ICD-10-CM

## 2015-11-02 DIAGNOSIS — E1169 Type 2 diabetes mellitus with other specified complication: Secondary | ICD-10-CM

## 2015-11-02 MED ORDER — PROMETHAZINE-DM 6.25-15 MG/5ML PO SYRP: ORAL_SOLUTION | ORAL | 0 refills | Status: DC

## 2015-11-02 MED ORDER — PSEUDOEPH-BROMPHEN-DM 30-2-10 MG/5ML PO SYRP: 5.0000 mL | ORAL_SOLUTION | Freq: Two times a day (BID) | ORAL | 0 refills | Status: DC | PRN

## 2015-11-02 NOTE — Telephone Encounter (Signed)
Spoke with patient and she is aware that prescription has been changed to equivalent.

## 2015-11-02 NOTE — Telephone Encounter (Signed)
Entered historically, pls send, pls let pt know has sleepy s/e so although states twice daily, try bedtime only first pls

## 2015-11-02 NOTE — Telephone Encounter (Signed)
Marylu LundJanet is asking if Dr. Lodema HongSimpson would go ahead and call her in some cough medicine, she is having trouble at night trying to sleep, please advise?

## 2015-11-02 NOTE — Telephone Encounter (Signed)
-----  Message from Fayrene Helper, MD sent at 11/01/2015 12:25 PM EDT ----- Labs for next visit are hBA1C, cmp and eGFr and vit D pls

## 2015-11-02 NOTE — Telephone Encounter (Signed)
The cough medicine sent in today is discontinued - need something else sent in

## 2015-11-02 NOTE — Telephone Encounter (Signed)
Pt aware.

## 2015-11-11 LAB — HM DIABETES EYE EXAM

## 2015-12-05 ENCOUNTER — Other Ambulatory Visit: Payer: Self-pay | Admitting: Family Medicine

## 2015-12-07 ENCOUNTER — Other Ambulatory Visit: Payer: Self-pay | Admitting: Family Medicine

## 2016-02-02 ENCOUNTER — Other Ambulatory Visit: Payer: Self-pay

## 2016-02-02 MED ORDER — METFORMIN HCL 500 MG PO TABS: 500.0000 mg | ORAL_TABLET | Freq: Three times a day (TID) | ORAL | 5 refills | Status: DC

## 2016-02-04 ENCOUNTER — Other Ambulatory Visit: Payer: Self-pay

## 2016-02-04 MED ORDER — METFORMIN HCL 500 MG PO TABS: 500.0000 mg | ORAL_TABLET | Freq: Three times a day (TID) | ORAL | 1 refills | Status: DC

## 2016-02-18 LAB — COMPLETE METABOLIC PANEL WITH GFR
ALT: 44 U/L — AB (ref 6–29)
AST: 22 U/L (ref 10–35)
Albumin: 4.4 g/dL (ref 3.6–5.1)
Alkaline Phosphatase: 52 U/L (ref 33–130)
BILIRUBIN TOTAL: 0.8 mg/dL (ref 0.2–1.2)
BUN: 20 mg/dL (ref 7–25)
CHLORIDE: 100 mmol/L (ref 98–110)
CO2: 25 mmol/L (ref 20–31)
CREATININE: 1.08 mg/dL — AB (ref 0.50–1.05)
Calcium: 10.1 mg/dL (ref 8.6–10.4)
GFR, Est African American: 68 mL/min (ref >=60)
GFR, Est Non African American: 59 mL/min — ABNORMAL LOW (ref >=60)
Glucose, Bld: 140 mg/dL — ABNORMAL HIGH (ref 65–99)
Potassium: 4.1 mmol/L (ref 3.5–5.3)
SODIUM: 137 mmol/L (ref 135–146)
TOTAL PROTEIN: 7.8 g/dL (ref 6.1–8.1)

## 2016-02-18 LAB — VITAMIN D 25 HYDROXY (VIT D DEFICIENCY, FRACTURES): Vit D, 25-Hydroxy: 35 ng/mL (ref 30–100)

## 2016-02-18 LAB — HEMOGLOBIN A1C
Hgb A1c MFr Bld: 6.9 % — ABNORMAL HIGH (ref <5.7)
Mean Plasma Glucose: 151 mg/dL

## 2016-02-24 ENCOUNTER — Ambulatory Visit: Payer: BLUE CROSS/BLUE SHIELD | Admitting: Family Medicine

## 2016-02-25 ENCOUNTER — Ambulatory Visit: Payer: BLUE CROSS/BLUE SHIELD | Admitting: Family Medicine

## 2016-02-28 ENCOUNTER — Ambulatory Visit (INDEPENDENT_AMBULATORY_CARE_PROVIDER_SITE_OTHER): Payer: BLUE CROSS/BLUE SHIELD | Admitting: Family Medicine

## 2016-02-28 ENCOUNTER — Encounter: Payer: Self-pay | Admitting: Family Medicine

## 2016-02-28 VITALS — BP 120/88 | HR 92 | Temp 98.4°F | Resp 20 | Ht 70.0 in | Wt 204.1 lb

## 2016-02-28 DIAGNOSIS — E669 Obesity, unspecified: Secondary | ICD-10-CM | POA: Diagnosis not present

## 2016-02-28 DIAGNOSIS — E663 Overweight: Secondary | ICD-10-CM

## 2016-02-28 DIAGNOSIS — I10 Essential (primary) hypertension: Secondary | ICD-10-CM | POA: Diagnosis not present

## 2016-02-28 DIAGNOSIS — E1169 Type 2 diabetes mellitus with other specified complication: Secondary | ICD-10-CM | POA: Diagnosis not present

## 2016-02-28 NOTE — Patient Instructions (Addendum)
F/u end April, call if you need me sooner  CBC, Fasting lipid, cmp and EGFr, hBA1c, TSH and microalb end April  No med changes  It is important that you exercise regularly at least 30 minutes 5 times a week. If you develop chest pain, have severe difficulty breathing, or feel very tired, stop exercising immediately and seek medical attention   Please work on good  health habits so that your health will improve. 1. Commitment to daily physical activity for 30 to 60  minutes, if you are able to do this.  2. Commitment to wise food choices. Aim for half of your  food intake to be vegetable and fruit, one quarter starchy foods, and one quarter protein. Try to eat on a regular schedule  3 meals per day, snacking between meals should be limited to vegetables or fruits or small portions of nuts. 64 ounces of water per day is generally recommended, unless you have specific health conditions, like heart failure or kidney failure where you will need to limit fluid intake.  3. Commitment to sufficient and a  good quality of physical and mental rest daily, generally between 6 to 8 hours per day.  WITH PERSISTANCE AND PERSEVERANCE, THE IMPOSSIBLE , BECOMES THE NORM!

## 2016-03-06 NOTE — Assessment & Plan Note (Signed)
Sub optimal Control, no change in medication DASH diet and commitment to daily physical activity for a minimum of 30 minutes discussed and encouraged, as a part of hypertension management. The importance of attaining a healthy weight is also discussed.  BP/Weight 02/28/2016 10/29/2015 06/14/2015 02/19/2015 01/01/2015 09/23/2014 06/09/2014  Systolic BP 120 124 128 128 - 604120 120  Diastolic BP 88 80 78 80 - 86 90  Wt. (Lbs) 204.12 201 209 203 207.1 203 203  BMI 29.29 28.84 29.99 29.13 29.72 29.13 29.13

## 2016-03-06 NOTE — Assessment & Plan Note (Signed)
Deteriorated. Patient re-educated about  the importance of commitment to a  minimum of 150 minutes of exercise per week.  The importance of healthy food choices with portion control discussed. Encouraged to start a food diary, count calories and to consider  joining a support group. Sample diet sheets offered. Goals set by the patient for the next several months.   Weight /BMI 02/28/2016 10/29/2015 06/14/2015  WEIGHT 204 lb 1.9 oz 201 lb 209 lb  HEIGHT 5\' 10"  5\' 10"  5\' 10"   BMI 29.29 kg/m2 28.84 kg/m2 29.99 kg/m2

## 2016-03-06 NOTE — Assessment & Plan Note (Signed)
Controlled, no change in medication [name] is reminded of the importance of commitment to daily physical activity for 30 minutes or more, as able and the need to limit carbohydrate intake to 30 to 60 grams per meal to help with blood sugar control.   The need to take medication as prescribed, test blood sugar as directed, and to call between visits if there is a concern that blood sugar is uncontrolled is also discussed.   [name] is reminded of the importance of daily foot exam, annual eye examination, and good blood sugar, blood pressure and cholesterol control.  Diabetic Labs Latest Ref Rng & Units 02/18/2016 10/29/2015 06/08/2015 06/09/2014 03/02/2014  HbA1c <5.7 % 6.9(H) 6.9(H) 7.3(H) 6.4(H) -  Microalbumin Not estab mg/dL - - 0.8 - 1.0  Micro/Creat Ratio <30 mcg/mg creat - - 7 - 6.3  Chol 125 - 200 mg/dL - - 161162 - -  HDL >=09>=46 mg/dL - - 60(A40(L) - -  Calc LDL <130 mg/dL - - 89 - -  Triglycerides <150 mg/dL - - 540(J164(H) - -  Creatinine 0.50 - 1.05 mg/dL 8.11(B1.08(H) 1.470.99 8.290.99 5.620.85 -   BP/Weight 02/28/2016 10/29/2015 06/14/2015 02/19/2015 01/01/2015 09/23/2014 06/09/2014  Systolic BP 120 124 128 128 - 130120 120  Diastolic BP 88 80 78 80 - 86 90  Wt. (Lbs) 204.12 201 209 203 207.1 203 203  BMI 29.29 28.84 29.99 29.13 29.72 29.13 29.13   Foot/eye exam completion dates Latest Ref Rng & Units 09/01/2014 06/09/2014  Eye Exam No Retinopathy No Retinopathy -  Foot Form Completion - - Done

## 2016-03-06 NOTE — Progress Notes (Signed)
Mack HookJanet Brand     MRN: [id number]      DOB: 03/16/1965   HPI [name] is here for follow up and re-evaluation of chronic medical conditions, medication management and review of any available recent lab and radiology data.  Preventive health is updated, specifically  Cancer screening and Immunization.   Questions or concerns regarding consultations or procedures which the PT has had in the interim are  addressed. The PT denies any adverse reactions to current medications since the last visit.  There are no new concerns.  There are no specific complaints   ROS Denies recent fever or chills. Denies sinus pressure, nasal congestion, ear pain or sore throat. Denies chest congestion, productive cough or wheezing. Denies chest pains, palpitations and leg swelling Denies abdominal pain, nausea, vomiting,diarrhea or constipation.   Denies dysuria, frequency, hesitancy or incontinence. Denies joint pain, swelling and limitation in mobility. Denies headaches, seizures, numbness, or tingling. Denies depression, anxiety or insomnia. Denies skin break down or rash.   PE  BP 120/88 (BP Location: Left Arm, Patient Position: Sitting, Cuff Size: Normal)   Pulse 92   Temp 98.4 F (36.9 C) (Oral)   Resp 20   Ht 5\' 10"  (1.778 m)   Wt 204 lb 1.9 oz (92.6 kg)   LMP 07/29/2014 (Exact Date)   SpO2 97%   BMI 29.29 kg/m   Patient alert and oriented and in no cardiopulmonary distress.  HEENT: No facial asymmetry, EOMI,   oropharynx pink and moist.  Neck supple no JVD, no mass.  Chest: Clear to auscultation bilaterally.  CVS: S1, S2 no murmurs, no S3.Regular rate.  ABD: Soft non tender.   Ext: No edema  MS: Adequate ROM spine, shoulders, hips and knees.  Skin: Intact, no ulcerations or rash noted.  Psych: Good eye contact, normal affect. Memory intact not anxious or depressed appearing.  CNS: CN 2-12 intact, power,  normal throughout.no focal deficits noted.   Assessment &  Plan  Essential hypertension Sub optimal Control, no change in medication DASH diet and commitment to daily physical activity for a minimum of 30 minutes discussed and encouraged, as a part of hypertension management. The importance of attaining a healthy weight is also discussed.  BP/Weight 02/28/2016 10/29/2015 06/14/2015 02/19/2015 01/01/2015 09/23/2014 06/09/2014  Systolic BP 120 124 128 128 - 657120 120  Diastolic BP 88 80 78 80 - 86 90  Wt. (Lbs) 204.12 201 209 203 207.1 203 203  BMI 29.29 28.84 29.99 29.13 29.72 29.13 29.13       Diabetes mellitus type 2 in obese (HCC) Controlled, no change in medication [name] is reminded of the importance of commitment to daily physical activity for 30 minutes or more, as able and the need to limit carbohydrate intake to 30 to 60 grams per meal to help with blood sugar control.   The need to take medication as prescribed, test blood sugar as directed, and to call between visits if there is a concern that blood sugar is uncontrolled is also discussed.   [name] is reminded of the importance of daily foot exam, annual eye examination, and good blood sugar, blood pressure and cholesterol control.  Diabetic Labs Latest Ref Rng & Units 02/18/2016 10/29/2015 06/08/2015 06/09/2014 03/02/2014  HbA1c <5.7 % 6.9(H) 6.9(H) 7.3(H) 6.4(H) -  Microalbumin Not estab mg/dL - - 0.8 - 1.0  Micro/Creat Ratio <30 mcg/mg creat - - 7 - 6.3  Chol 125 - 200 mg/dL - - 846162 - -  HDL >=46 mg/dL - - 16(X40(L) - -  Calc LDL <130 mg/dL - - 89 - -  Triglycerides <150 mg/dL - - 096(E164(H) - -  Creatinine 0.50 - 1.05 mg/dL 4.54(U1.08(H) 9.810.99 1.910.99 4.780.85 -   BP/Weight 02/28/2016 10/29/2015 06/14/2015 02/19/2015 01/01/2015 09/23/2014 06/09/2014  Systolic BP 120 124 128 128 - 295120 120  Diastolic BP 88 80 78 80 - 86 90  Wt. (Lbs) 204.12 201 209 203 207.1 203 203  BMI 29.29 28.84 29.99 29.13 29.72 29.13 29.13   Foot/eye exam completion dates Latest Ref Rng & Units 09/01/2014 06/09/2014  Eye Exam No  Retinopathy No Retinopathy -  Foot Form Completion - - Done        Overweight (BMI 25.0-29.9) Deteriorated. Patient re-educated about  the importance of commitment to a  minimum of 150 minutes of exercise per week.  The importance of healthy food choices with portion control discussed. Encouraged to start a food diary, count calories and to consider  joining a support group. Sample diet sheets offered. Goals set by the patient for the next several months.   Weight /BMI 02/28/2016 10/29/2015 06/14/2015  WEIGHT 204 lb 1.9 oz 201 lb 209 lb  HEIGHT 5\' 10"  5\' 10"  5\' 10"   BMI 29.29 kg/m2 28.84 kg/m2 29.99 kg/m2

## 2016-03-24 ENCOUNTER — Encounter: Payer: Self-pay | Admitting: Family Medicine

## 2016-03-24 ENCOUNTER — Other Ambulatory Visit: Payer: Self-pay

## 2016-03-24 ENCOUNTER — Other Ambulatory Visit: Payer: Self-pay | Admitting: Family Medicine

## 2016-03-24 MED ORDER — PROMETHAZINE-DM 6.25-15 MG/5ML PO SYRP: ORAL_SOLUTION | ORAL | 0 refills | Status: DC

## 2016-04-06 ENCOUNTER — Other Ambulatory Visit: Payer: Self-pay | Admitting: Family Medicine

## 2016-04-11 ENCOUNTER — Ambulatory Visit (HOSPITAL_COMMUNITY)
Admission: RE | Admit: 2016-04-11 | Discharge: 2016-04-11 | Disposition: A | Discharge status: Home / Self Care | Payer: 59 | Source: Ambulatory Visit | Attending: Family Medicine | Admitting: Family Medicine

## 2016-04-11 ENCOUNTER — Other Ambulatory Visit: Payer: Self-pay | Admitting: Family Medicine

## 2016-04-11 ENCOUNTER — Encounter: Payer: Self-pay | Admitting: Family Medicine

## 2016-04-11 ENCOUNTER — Ambulatory Visit (INDEPENDENT_AMBULATORY_CARE_PROVIDER_SITE_OTHER): Payer: 59 | Admitting: Family Medicine

## 2016-04-11 ENCOUNTER — Other Ambulatory Visit (HOSPITAL_COMMUNITY)
Admission: RE | Admit: 2016-04-11 | Discharge: 2016-04-11 | Disposition: A | Discharge status: Home / Self Care | Payer: 59 | Source: Ambulatory Visit | Attending: Family Medicine | Admitting: Family Medicine

## 2016-04-11 VITALS — BP 110/78 | HR 103 | Temp 100.0°F | Resp 16 | Ht 70.0 in | Wt 203.0 lb

## 2016-04-11 DIAGNOSIS — J111 Influenza due to unidentified influenza virus with other respiratory manifestations: Secondary | ICD-10-CM

## 2016-04-11 DIAGNOSIS — J029 Acute pharyngitis, unspecified: Secondary | ICD-10-CM

## 2016-04-11 DIAGNOSIS — R509 Fever, unspecified: Secondary | ICD-10-CM | POA: Insufficient documentation

## 2016-04-11 DIAGNOSIS — I10 Essential (primary) hypertension: Secondary | ICD-10-CM

## 2016-04-11 DIAGNOSIS — R05 Cough: Secondary | ICD-10-CM

## 2016-04-11 DIAGNOSIS — J209 Acute bronchitis, unspecified: Secondary | ICD-10-CM

## 2016-04-11 DIAGNOSIS — R110 Nausea: Secondary | ICD-10-CM

## 2016-04-11 DIAGNOSIS — R112 Nausea with vomiting, unspecified: Secondary | ICD-10-CM

## 2016-04-11 DIAGNOSIS — R059 Cough, unspecified: Secondary | ICD-10-CM

## 2016-04-11 DIAGNOSIS — J9811 Atelectasis: Secondary | ICD-10-CM | POA: Insufficient documentation

## 2016-04-11 DIAGNOSIS — R9389 Abnormal findings on diagnostic imaging of other specified body structures: Secondary | ICD-10-CM

## 2016-04-11 LAB — CBC
HEMATOCRIT: 37.6 % (ref 36.0–46.0)
HEMOGLOBIN: 12.6 g/dL (ref 12.0–15.0)
MCH: 29.6 pg (ref 26.0–34.0)
MCHC: 33.5 g/dL (ref 30.0–36.0)
MCV: 88.5 fL (ref 78.0–100.0)
Platelets: 313 10*3/uL (ref 150–400)
RBC: 4.25 MIL/uL (ref 3.87–5.11)
RDW: 14.3 % (ref 11.5–15.5)
WBC: 5.3 10*3/uL (ref 4.0–10.5)

## 2016-04-11 LAB — DIFFERENTIAL
Basophils Absolute: 0 10*3/uL (ref 0.0–0.1)
Basophils Relative: 0 %
Eosinophils Absolute: 0 10*3/uL (ref 0.0–0.7)
Eosinophils Relative: 0 %
LYMPHS ABS: 1.8 10*3/uL (ref 0.7–4.0)
Lymphocytes Relative: 32 %
MONOS PCT: 13 %
Monocytes Absolute: 0.8 10*3/uL (ref 0.1–1.0)
NEUTROS ABS: 3.1 10*3/uL (ref 1.7–7.7)
Neutrophils Relative %: 55 %

## 2016-04-11 LAB — COMPREHENSIVE METABOLIC PANEL
ALK PHOS: 48 U/L (ref 38–126)
ALT: 53 U/L (ref 14–54)
AST: 26 U/L (ref 15–41)
Albumin: 4.2 g/dL (ref 3.5–5.0)
Anion gap: 11 (ref 5–15)
BILIRUBIN TOTAL: 0.6 mg/dL (ref 0.3–1.2)
BUN: 19 mg/dL (ref 6–20)
CALCIUM: 9.3 mg/dL (ref 8.9–10.3)
CO2: 26 mmol/L (ref 22–32)
CREATININE: 1.24 mg/dL — AB (ref 0.44–1.00)
Chloride: 99 mmol/L — ABNORMAL LOW (ref 101–111)
GFR calc Af Amer: 57 mL/min — ABNORMAL LOW (ref >60)
GFR calc non Af Amer: 49 mL/min — ABNORMAL LOW (ref >60)
GLUCOSE: 130 mg/dL — AB (ref 65–99)
Potassium: 3.7 mmol/L (ref 3.5–5.1)
SODIUM: 136 mmol/L (ref 135–145)
TOTAL PROTEIN: 8.2 g/dL — AB (ref 6.5–8.1)

## 2016-04-11 MED ORDER — ONDANSETRON HCL 4 MG PO TABS: 4.0000 mg | ORAL_TABLET | Freq: Three times a day (TID) | ORAL | 0 refills | Status: DC | PRN

## 2016-04-11 MED ORDER — ONDANSETRON HCL 4 MG/2ML IJ SOLN
4.0000 mg | Freq: Once | INTRAMUSCULAR | Status: AC
  Administered 2016-04-11: 4 mg via INTRAMUSCULAR

## 2016-04-11 MED ORDER — OSELTAMIVIR PHOSPHATE 75 MG PO CAPS: 75.0000 mg | ORAL_CAPSULE | Freq: Two times a day (BID) | ORAL | 0 refills | Status: DC

## 2016-04-11 MED ORDER — CLARITHROMYCIN 500 MG PO TABS: 500.0000 mg | ORAL_TABLET | Freq: Two times a day (BID) | ORAL | 0 refills | Status: DC

## 2016-04-11 NOTE — Patient Instructions (Addendum)
F/u as before, call if you need me sooner  Work excuse from today return Apr 17, 2016  CXR and labs at hospital today, CBC and diff and cmp Throat swab is negative for strep  infection You are being treated for influenza and bronchitis, tamiflu and clarithromycin are sent to your pharmacy  Zofran is given for nausea, and some is also sent  in  Fluids, rest, medication are all needed to heal. Tylenol 325 mg every 6 to 8 hours for fever and pain may be used  Brat diet is recommended until you start feeling better       Food Choices to Help Relieve Diarrhea, Adult When you have diarrhea, the foods you eat and your eating habits are very important. Choosing the right foods and drinks can help relieve diarrhea. Also, because diarrhea can last up to 7 days, you need to replace lost fluids and electrolytes (such as sodium, potassium, and chloride) in order to help prevent dehydration. What general guidelines do I need to follow?  Slowly drink 1 cup (8 oz) of fluid for each episode of diarrhea. If you are getting enough fluid, your urine will be clear or pale yellow.  Eat starchy foods. Some good choices include white rice, white toast, pasta, low-fiber cereal, baked potatoes (without the skin), saltine crackers, and bagels.  Avoid large servings of any cooked vegetables.  Limit fruit to two servings per day. A serving is  cup or 1 small piece.  Choose foods with less than 2 g of fiber per serving.  Limit fats to less than 8 tsp (38 g) per day.  Avoid fried foods.  Eat foods that have probiotics in them. Probiotics can be found in certain dairy products.  Avoid foods and beverages that may increase the speed at which food moves through the stomach and intestines (gastrointestinal tract). Things to avoid include:  High-fiber foods, such as dried fruit, raw fruits and vegetables, nuts, seeds, and whole grain foods.  Spicy foods and high-fat foods.  Foods and beverages sweetened  with high-fructose corn syrup, honey, or sugar alcohols such as xylitol, sorbitol, and mannitol. What foods are recommended? Grains  White rice. White, [place], or pita breads (fresh or toasted), including plain rolls, buns, or bagels. White pasta. Saltine, soda, or graham crackers. Pretzels. Low-fiber cereal. Cooked cereals made with water (such as cornmeal, farina, or cream cereals). Plain muffins. Matzo. Melba toast. Zwieback. Vegetables  Potatoes (without the skin). Strained tomato and vegetable juices. Most well-cooked and canned vegetables without seeds. Tender lettuce. Fruits  Cooked or canned applesauce, apricots, cherries, fruit cocktail, grapefruit, peaches, pears, or plums. Fresh bananas, apples without skin, cherries, grapes, cantaloupe, grapefruit, peaches, oranges, or plums. Meat and Other Protein Products  Baked or boiled chicken. Eggs. Tofu. Fish. Seafood. Smooth peanut butter. Ground or well-cooked tender beef, ham, veal, lamb, pork, or poultry. Dairy  Plain yogurt, kefir, and unsweetened liquid yogurt. Lactose-free milk, buttermilk, or soy milk. Plain hard cheese. Beverages  Sport drinks. Clear broths. Diluted fruit juices (except prune). Regular, caffeine-free sodas such as ginger ale. Water. Decaffeinated teas. Oral rehydration solutions. Sugar-free beverages not sweetened with sugar alcohols. Other  Bouillon, broth, or soups made from recommended foods. The items listed above may not be a complete list of recommended foods or beverages. Contact your dietitian for more options.  What foods are not recommended? Grains  Whole grain, whole wheat, bran, or rye breads, rolls, pastas, crackers, and cereals. Wild or brown rice. Cereals that contain more  than 2 g of fiber per serving. Corn tortillas or taco shells. Cooked or dry oatmeal. Granola. Popcorn. Vegetables  Raw vegetables. Cabbage, broccoli, Brussels sprouts, artichokes, baked beans, beet greens, corn, kale, legumes, peas,  sweet potatoes, and yams. Potato skins. Cooked spinach and cabbage. Fruits  Dried fruit, including raisins and dates. Raw fruits. Stewed or dried prunes. Fresh apples with skin, apricots, mangoes, pears, raspberries, and strawberries. Meat and Other Protein Products  Chunky peanut butter. Nuts and seeds. Beans and lentils. Tomasa BlaseBacon. Dairy  High-fat cheeses. Milk, chocolate milk, and beverages made with milk, such as milk shakes. Cream. Ice cream. Sweets and Desserts  Sweet rolls, doughnuts, and sweet breads. Pancakes and waffles. Fats and Oils  Butter. Cream sauces. Margarine. Salad oils. Plain salad dressings. Olives. Avocados. Beverages  Caffeinated beverages (such as coffee, tea, soda, or energy drinks). Alcoholic beverages. Fruit juices with pulp. Prune juice. Soft drinks sweetened with high-fructose corn syrup or sugar alcohols. Other  Coconut. Hot sauce. Chili powder. Mayonnaise. Gravy. Cream-based or milk-based soups. The items listed above may not be a complete list of foods and beverages to avoid. Contact your dietitian for more information.  What should I do if I become dehydrated? Diarrhea can sometimes lead to dehydration. Signs of dehydration include dark urine and dry mouth and skin. If you think you are dehydrated, you should rehydrate with an oral rehydration solution. These solutions can be purchased at pharmacies, retail stores, or online. Drink -1 cup (120-240 mL) of oral rehydration solution each time you have an episode of diarrhea. If drinking this amount makes your diarrhea worse, try drinking smaller amounts more often. For example, drink 1-3 tsp (5-15 mL) every 5-10 minutes. A general rule for staying hydrated is to drink 1-2 L of fluid per day. Talk to your health care provider about the specific amount you should be drinking each day. Drink enough fluids to keep your urine clear or pale yellow. This information is not intended to replace advice given to you by your  health care provider. Make sure you discuss any questions you have with your health care provider. Document Released: 05/20/2003 Document Revised: 08/05/2015 Document Reviewed: 01/20/2013 Elsevier Interactive Patient Education  2017 [employer]

## 2016-04-11 NOTE — Progress Notes (Signed)
   Felicia HookJanet Schneider     MRN: [id number]      DOB: 07/18/1965   HPI [name] with a 3 day h/o chills, malaise , body aches, cough, sneezing, worse yesterday. Had been coughing since Jan 12, but adequate relief with nocturnal cough syrup Nasal drainage is clear Cough is through day and night, no excessive nasal Had GI symptoms, loose stool and nausea States she is unclear how she safely travelled from home to work and back yesterday an how she functioned  ROS See HPI  Denies chest pains, palpitations and leg swelling .   Denies dysuria, frequency, hesitancy or incontinence.  Denies  seizures, numbness, or tingling. Denies depression, anxiety or insomnia. Denies skin break down or rash.   PE  BP 110/78   Pulse (!) 103   Temp 100 F (37.8 C) (Oral)   Resp 16   Ht 5\' 10"  (1.778 m)   Wt 203 lb (92.1 kg)   LMP 07/29/2014 (Exact Date)   SpO2 97%   BMI 29.13 kg/m   Patient ill appearing  and oriented and in no cardiopulmonary distress.  HEENT: No facial asymmetry, EOMI,   oropharynx pink and moist. Tonsils enlarged and erythematous, no exudate Neck supple no JVD, bilateral anterior cervical adenitis.  Chest: decreased air entry with bibasilar crackles, no wheezes.  CVS: S1, S2 no murmurs, no S3.Regular rate.  ABD: Soft superficial epigastric tenderness, hyperactive BS  Ext: No edema  MS: Adequate ROM spine, shoulders, hips and knees.  Skin: Intact, no ulcerations or rash noted.  Psych: Good eye contact, normal affect. Memory intact not anxious or depressed appearing.  CNS: CN 2-12 intact, power,  normal throughout.no focal deficits noted.   Assessment & Plan  Sore throat Symptomatic with negative rapid strep test  Nausea & vomiting zofran 4mg  Im in office and med sent,  not clinically dehydrated  Influenza Symptomatic and supportive treatment, work excuse and tamiflu  Acute bronchitis CXR and basic lab data today, and also antibiotic course of biaxin, has  decongestant and cough suppressant at home  Essential hypertension Controlled, no change in medication DASH diet and commitment to daily physical activity for a minimum of 30 minutes discussed and encouraged, as a part of hypertension management. The importance of attaining a healthy weight is also discussed.  BP/Weight 04/11/2016 02/28/2016 10/29/2015 06/14/2015 02/19/2015 01/01/2015 09/23/2014  Systolic BP 110 120 124 128 128 - 120  Diastolic BP 78 88 80 78 80 - 86  Wt. (Lbs) 203 204.12 201 209 203 207.1 203  BMI 29.13 29.29 28.84 29.99 29.13 29.72 29.13

## 2016-04-11 NOTE — Progress Notes (Signed)
Fever

## 2016-04-12 ENCOUNTER — Telehealth: Payer: Self-pay

## 2016-04-12 DIAGNOSIS — I10 Essential (primary) hypertension: Secondary | ICD-10-CM

## 2016-04-12 NOTE — Telephone Encounter (Signed)
-----  Message from Fayrene Helper, MD sent at 04/11/2016  5:16 PM EST ----- I discussed and messaged pt re labs, Pls mail an order non fast chem 7 and EGFR to be done March 5 or that week regular , not at hospital.  send extra note to ensure 64 oz water or more daily, Thanks  ?? pls ask

## 2016-04-16 DIAGNOSIS — R112 Nausea with vomiting, unspecified: Secondary | ICD-10-CM | POA: Insufficient documentation

## 2016-04-16 DIAGNOSIS — J029 Acute pharyngitis, unspecified: Secondary | ICD-10-CM | POA: Insufficient documentation

## 2016-04-16 DIAGNOSIS — J209 Acute bronchitis, unspecified: Secondary | ICD-10-CM | POA: Insufficient documentation

## 2016-04-16 NOTE — Assessment & Plan Note (Signed)
Symptomatic with negative rapid strep test

## 2016-04-16 NOTE — Assessment & Plan Note (Signed)
CXR and basic lab data today, and also antibiotic course of biaxin, has decongestant and cough suppressant at home

## 2016-04-16 NOTE — Assessment & Plan Note (Signed)
Controlled, no change in medication DASH diet and commitment to daily physical activity for a minimum of 30 minutes discussed and encouraged, as a part of hypertension management. The importance of attaining a healthy weight is also discussed.  BP/Weight 04/11/2016 02/28/2016 10/29/2015 06/14/2015 02/19/2015 01/01/2015 09/23/2014  Systolic BP 110 120 124 128 128 - 120  Diastolic BP 78 88 80 78 80 - 86  Wt. (Lbs) 203 204.12 201 209 203 207.1 203  BMI 29.13 29.29 28.84 29.99 29.13 29.72 29.13

## 2016-04-16 NOTE — Assessment & Plan Note (Addendum)
zofran 4mg  Im in office and med sent,  not clinically dehydrated

## 2016-04-16 NOTE — Assessment & Plan Note (Signed)
Symptomatic and supportive treatment, work excuse and tamiflu

## 2016-04-17 LAB — POCT RAPID STREP A (OFFICE): Rapid Strep A Screen: NEGATIVE

## 2016-04-17 NOTE — Addendum Note (Signed)
Addended by: Abner GreenspanHUDY, BRANDI H on: 04/17/2016 09:15 AM   Modules accepted: Orders

## 2016-05-06 ENCOUNTER — Other Ambulatory Visit: Payer: Self-pay | Admitting: Family Medicine

## 2016-05-18 ENCOUNTER — Other Ambulatory Visit: Payer: Self-pay | Admitting: Family Medicine

## 2016-05-18 DIAGNOSIS — Z1231 Encounter for screening mammogram for malignant neoplasm of breast: Secondary | ICD-10-CM

## 2016-05-18 LAB — BASIC METABOLIC PANEL WITH GFR
BUN: 21 mg/dL (ref 7–25)
CALCIUM: 9.6 mg/dL (ref 8.6–10.4)
CO2: 24 mmol/L (ref 20–31)
Chloride: 101 mmol/L (ref 98–110)
Creat: 1.04 mg/dL (ref 0.50–1.05)
GFR, EST NON AFRICAN AMERICAN: 62 mL/min (ref >=60)
GFR, Est African American: 71 mL/min (ref >=60)
GLUCOSE: 161 mg/dL — AB (ref 65–99)
POTASSIUM: 3.4 mmol/L — AB (ref 3.5–5.3)
SODIUM: 140 mmol/L (ref 135–146)

## 2016-05-19 ENCOUNTER — Encounter: Payer: Self-pay | Admitting: Family Medicine

## 2016-05-22 ENCOUNTER — Encounter: Payer: Self-pay | Admitting: Women's Health

## 2016-05-24 ENCOUNTER — Ambulatory Visit (INDEPENDENT_AMBULATORY_CARE_PROVIDER_SITE_OTHER): Payer: 59 | Admitting: Women's Health

## 2016-05-24 ENCOUNTER — Encounter: Payer: Self-pay | Admitting: Women's Health

## 2016-05-24 VITALS — BP 117/82 | Ht 69.0 in | Wt 203.4 lb

## 2016-05-24 DIAGNOSIS — Z1151 Encounter for screening for human papillomavirus (HPV): Secondary | ICD-10-CM | POA: Diagnosis not present

## 2016-05-24 DIAGNOSIS — Z01419 Encounter for gynecological examination (general) (routine) without abnormal findings: Secondary | ICD-10-CM

## 2016-05-24 NOTE — Progress Notes (Signed)
Felicia HookJanet Schneider 01/18/1966 [id number]    History:    Presents for annual exam. Postmenopausal for one year, no bleeding on no HRT, rare hot flashes. History of normal pap and mammograms. Colonoscopy done in 2015, reschedule in 5 years,  Felicia Schneider's colon cancer 2017 surgical removal no further treatment survivor. Flu 1 week ago.  Past medical history, past surgical history, family history and social history were all reviewed and documented in the EPIC chart. Works for [employer], new program with treadmills available during work hours/phone calls. Has 7022 old son and 5426 old daughter, both college students. Felicia Schneider  hypertension, diabetes.  ROS:  A ROS was performed and pertinent positives and negatives are included.  Exam:  Vitals:   05/24/16 1042  BP: 117/82  Weight: 203 lb 6.4 oz (92.3 kg)  Height: 5\' 9"  (1.753 m)   Body mass index is 30.04 kg/m.   General appearance:  Normal Thyroid:  Symmetrical, normal in size, without palpable masses or nodularity. Respiratory  Auscultation:  Clear without wheezing or rhonchi Cardiovascular  Auscultation:  Regular rate, without rubs, murmurs or gallops  Edema/varicosities:  Not grossly evident Abdominal  Soft,nontender, without masses, guarding or rebound.  Liver/spleen:  No organomegaly noted  Hernia:  None appreciated  Skin  Inspection:  Grossly normal   Breasts: Examined lying and sitting.     Right: Without masses, retractions, discharge or axillary adenopathy.     Left: Without masses, retractions, discharge or axillary adenopathy. Gentitourinary   Inguinal/mons:  Normal without inguinal adenopathy  External genitalia:  Normal  BUS/Urethra/Skene's glands:  Normal  Vagina:  Normal, Pap HP, HPV type  Cervix:  Normal  Uterus:  normal in size, shape and contour.  Midline and mobile  Adnexa/parametria:     Rt: Without masses or tenderness.   Lt: Without masses or tenderness.  Anus and perineum: Normal  Digital rectal exam: Normal  sphincter tone without palpated masses or tenderness  Postmenopausal /No bleeding 1 year/no HRT Diabetes type 2/I Hypertension-primary care manages labs and meds  Assessment/Plan:  51 y.o. MBF G2 P2  for annual exam with no complaints.    Plan: SBE's, continue annual screening mammogram, regular exercise, calcium rich diet, vitamin D 2000 daily encouraged.  Decrease calories for weight loss encouraged. Pap with HR HPV typing, new screening guidelines reviewed.   Harrington ChallengerYOUNG,Messiah Rovira J [facility] CenterWHNP, 11:25 AM 05/24/2016

## 2016-05-24 NOTE — Patient Instructions (Signed)
Carbohydrate Counting for Diabetes Mellitus, Adult Carbohydrate counting is a method for keeping track of how many carbohydrates you eat. Eating carbohydrates naturally increases the amount of sugar (glucose) in the blood. Counting how many carbohydrates you eat helps keep your blood glucose within normal limits, which helps you manage your diabetes (diabetes mellitus). It is important to know how many carbohydrates you can safely have in each meal. This is different for every person. A diet and nutrition specialist (registered dietitian) can help you make a meal plan and calculate how many carbohydrates you should have at each meal and snack. Carbohydrates are found in the following foods:  Grains, such as breads and cereals.  Dried beans and soy products.  Starchy vegetables, such as potatoes, peas, and corn.  Fruit and fruit juices.  Milk and yogurt.  Sweets and snack foods, such as cake, cookies, candy, chips, and soft drinks. How do I count carbohydrates? There are two ways to count carbohydrates in food. You can use either of the methods or a combination of both. Reading "Nutrition Facts" on packaged food  The "Nutrition Facts" list is included on the labels of almost all packaged foods and beverages in the [place] It includes:  The serving size.  Information about nutrients in each serving, including the grams (g) of carbohydrate per serving. To use the "Nutrition Facts":  Decide how many servings you will have.  Multiply the number of servings by the number of carbohydrates per serving.  The resulting number is the total amount of carbohydrates that you will be having. Learning standard serving sizes of other foods  When you eat foods containing carbohydrates that are not packaged or do not include "Nutrition Facts" on the label, you need to measure the servings in order to count the amount of carbohydrates:  Measure the foods that you will eat with a food scale or measuring  cup, if needed.  Decide how many standard-size servings you will eat.  Multiply the number of servings by 15. Most carbohydrate-rich foods have about 15 g of carbohydrates per serving.  For example, if you eat 8 oz (170 g) of strawberries, you will have eaten 2 servings and 30 g of carbohydrates (2 servings x 15 g = 30 g).  For foods that have more than one food mixed, such as soups and casseroles, you must count the carbohydrates in each food that is included. The following list contains standard serving sizes of common carbohydrate-rich foods. Each of these servings has about 15 g of carbohydrates:   hamburger bun or  English muffin.   oz (15 mL) syrup.   oz (14 g) jelly.  1 slice of bread.  1 six-inch tortilla.  3 oz (85 g) cooked rice or pasta.  4 oz (113 g) cooked dried beans.  4 oz (113 g) starchy vegetable, such as peas, corn, or potatoes.  4 oz (113 g) hot cereal.  4 oz (113 g) mashed potatoes or  of a large baked potato.  4 oz (113 g) canned or frozen fruit.  4 oz (120 mL) fruit juice.  4-6 crackers.  6 chicken nuggets.  6 oz (170 g) unsweetened dry cereal.  6 oz (170 g) plain fat-free yogurt or yogurt sweetened with artificial sweeteners.  8 oz (240 mL) milk.  8 oz (170 g) fresh fruit or one small piece of fruit.  24 oz (680 g) popped popcorn. Example of carbohydrate counting Sample meal   3 oz (85 g) chicken breast.  6  oz (170 g) brown rice.  4 oz (113 g) corn.  8 oz (240 mL) milk.  8 oz (170 g) strawberries with sugar-free whipped topping. Carbohydrate calculation  1. Identify the foods that contain carbohydrates:  Rice.  Corn.  Milk.  Strawberries. 2. Calculate how many servings you have of each food:  2 servings rice.  1 serving corn.  1 serving milk.  1 serving strawberries. 3. Multiply each number of servings by 15 g:  2 servings rice x 15 g = 30 g.  1 serving corn x 15 g = 15 g.  1 serving milk x 15 g = 15  g.  1 serving strawberries x 15 g = 15 g. 4. Add together all of the amounts to find the total grams of carbohydrates eaten:  30 g + 15 g + 15 g + 15 g = 75 g of carbohydrates total. This information is not intended to replace advice given to you by your health care provider. Make sure you discuss any questions you have with your health care provider. Document Released: 02/27/2005 Document Revised: 09/17/2015 Document Reviewed: 08/11/2015 Elsevier Interactive Patient Education  2017 [place] Maintenance for Postmenopausal Women Menopause is a normal process in which your reproductive ability comes to an end. This process happens gradually over a span of months to years, usually between the ages of 85 and 43. Menopause is complete when you have missed 12 consecutive menstrual periods. It is important to talk with your health care provider about some of the most common conditions that affect postmenopausal women, such as heart disease, cancer, and bone loss (osteoporosis). Adopting a healthy lifestyle and getting preventive care can help to promote your health and wellness. Those actions can also lower your chances of developing some of these common conditions. What should I know about menopause? During menopause, you may experience a number of symptoms, such as:  Moderate-to-severe hot flashes.  Night sweats.  Decrease in sex drive.  Mood swings.  Headaches.  Tiredness.  Irritability.  Memory problems.  Insomnia. Choosing to treat or not to treat menopausal changes is an individual decision that you make with your health care provider. What should I know about hormone replacement therapy and supplements? Hormone therapy products are effective for treating symptoms that are associated with menopause, such as hot flashes and night sweats. Hormone replacement carries certain risks, especially as you become older. If you are thinking about using estrogen or estrogen with  progestin treatments, discuss the benefits and risks with your health care provider. What should I know about heart disease and stroke? Heart disease, heart attack, and stroke become more likely as you age. This may be due, in part, to the hormonal changes that your body experiences during menopause. These can affect how your body processes dietary fats, triglycerides, and cholesterol. Heart attack and stroke are both medical emergencies. There are many things that you can do to help prevent heart disease and stroke:  Have your blood pressure checked at least every 1-2 years. High blood pressure causes heart disease and increases the risk of stroke.  If you are 59-65 years old, ask your health care provider if you should take aspirin to prevent a heart attack or a stroke.  Do not use any tobacco products, including cigarettes, chewing tobacco, or electronic cigarettes. If you need help quitting, ask your health care provider.  It is important to eat a healthy diet and maintain a healthy weight.  Be sure to include plenty  of vegetables, fruits, low-fat dairy products, and lean protein.  Avoid eating foods that are high in solid fats, added sugars, or salt (sodium).  Get regular exercise. This is one of the most important things that you can do for your health.  Try to exercise for at least 150 minutes each week. The type of exercise that you do should increase your heart rate and make you sweat. This is known as moderate-intensity exercise.  Try to do strengthening exercises at least twice each week. Do these in addition to the moderate-intensity exercise.  Know your numbers.Ask your health care provider to check your cholesterol and your blood glucose. Continue to have your blood tested as directed by your health care provider. What should I know about cancer screening? There are several types of cancer. Take the following steps to reduce your risk and to catch any cancer development as  early as possible. Breast Cancer  Practice breast self-awareness.  This means understanding how your breasts normally appear and feel.  It also means doing regular breast self-exams. Let your health care provider know about any changes, no matter how small.  If you are 50 or older, have a clinician do a breast exam (clinical breast exam or CBE) every year. Depending on your age, family history, and medical history, it may be recommended that you also have a yearly breast X-ray (mammogram).  If you have a family history of breast cancer, talk with your health care provider about genetic screening.  If you are at high risk for breast cancer, talk with your health care provider about having an MRI and a mammogram every year.  Breast cancer (BRCA) gene test is recommended for women who have family members with BRCA-related cancers. Results of the assessment will determine the need for genetic counseling and BRCA1 and for BRCA2 testing. BRCA-related cancers include these types:  Breast. This occurs in males or females.  Ovarian.  Tubal. This may also be called fallopian tube cancer.  Cancer of the abdominal or pelvic lining (peritoneal cancer).  Prostate.  Pancreatic. Cervical, Uterine, and Ovarian Cancer  Your health care provider may recommend that you be screened regularly for cancer of the pelvic organs. These include your ovaries, uterus, and vagina. This screening involves a pelvic exam, which includes checking for microscopic changes to the surface of your cervix (Pap test).  For women ages 21-65, health care providers may recommend a pelvic exam and a Pap test every three years. For women ages 69-65, they may recommend the Pap test and pelvic exam, combined with testing for human papilloma virus (HPV), every five years. Some types of HPV increase your risk of cervical cancer. Testing for HPV may also be done on women of any age who have unclear Pap test results.  Other health care  providers may not recommend any screening for nonpregnant women who are considered low risk for pelvic cancer and have no symptoms. Ask your health care provider if a screening pelvic exam is right for you.  If you have had past treatment for cervical cancer or a condition that could lead to cancer, you need Pap tests and screening for cancer for at least 20 years after your treatment. If Pap tests have been discontinued for you, your risk factors (such as having a new sexual partner) need to be reassessed to determine if you should start having screenings again. Some women have medical problems that increase the chance of getting cervical cancer. In these cases, your health care  provider may recommend that you have screening and Pap tests more often.  If you have a family history of uterine cancer or ovarian cancer, talk with your health care provider about genetic screening.  If you have vaginal bleeding after reaching menopause, tell your health care provider.  There are currently no reliable tests available to screen for ovarian cancer. Lung Cancer  Lung cancer screening is recommended for adults 37-11 years old who are at high risk for lung cancer because of a history of smoking. A yearly low-dose CT scan of the lungs is recommended if you:  Currently smoke.  Have a history of at least 30 pack-years of smoking and you currently smoke or have quit within the past 15 years. A pack-year is smoking an average of one pack of cigarettes per day for one year. Yearly screening should:  Continue until it has been 15 years since you quit.  Stop if you develop a health problem that would prevent you from having lung cancer treatment. Colorectal Cancer  This type of cancer can be detected and can often be prevented.  Routine colorectal cancer screening usually begins at age 44 and continues through age 49.  If you have risk factors for colon cancer, your health care provider may recommend that you  be screened at an earlier age.  If you have a family history of colorectal cancer, talk with your health care provider about genetic screening.  Your health care provider may also recommend using home test kits to check for hidden blood in your stool.  A small camera at the end of a tube can be used to examine your colon directly (sigmoidoscopy or colonoscopy). This is done to check for the earliest forms of colorectal cancer.  Direct examination of the colon should be repeated every 5-10 years until age 26. However, if early forms of precancerous polyps or small growths are found or if you have a family history or genetic risk for colorectal cancer, you may need to be screened more often. Skin Cancer  Check your skin from head to toe regularly.  Monitor any moles. Be sure to tell your health care provider:  About any new moles or changes in moles, especially if there is a change in a mole's shape or color.  If you have a mole that is larger than the size of a pencil eraser.  If any of your family members has a history of skin cancer, especially at a Garrett [place]ring age, talk with your health care provider about genetic screening.  Always use sunscreen. Apply sunscreen liberally and repeatedly throughout the day.  Whenever you are outside, protect yourself by wearing long sleeves, pants, a wide-brimmed hat, and sunglasses. What should I know about osteoporosis? Osteoporosis is a condition in which bone destruction happens more quickly than new bone creation. After menopause, you may be at an increased risk for osteoporosis. To help prevent osteoporosis or the bone fractures that can happen because of osteoporosis, the following is recommended:  If you are 89-67 years old, get at least 1,000 mg of calcium and at least 600 mg of vitamin D per day.  If you are older than age 59 but younger than age 38, get at least 1,200 mg of calcium and at least 600 mg of vitamin D per day.  If you are older than  age 70, get at least 1,200 mg of calcium and at least 800 mg of vitamin D per day. Smoking and excessive alcohol intake increase the  risk of osteoporosis. Eat foods that are rich in calcium and vitamin D, and do weight-bearing exercises several times each week as directed by your health care provider. What should I know about how menopause affects my mental health? Depression may occur at any age, but it is more common as you become older. Common symptoms of depression include:  Low or sad mood.  Changes in sleep patterns.  Changes in appetite or eating patterns.  Feeling an overall lack of motivation or enjoyment of activities that you previously enjoyed.  Frequent crying spells. Talk with your health care provider if you think that you are experiencing depression. What should I know about immunizations? It is important that you get and maintain your immunizations. These include:  Tetanus, diphtheria, and pertussis (Tdap) booster vaccine.  Influenza every year before the flu season begins.  Pneumonia vaccine.  Shingles vaccine. Your health care provider may also recommend other immunizations. This information is not intended to replace advice given to you by your health care provider. Make sure you discuss any questions you have with your health care provider. Document Released: 04/21/2005 Document Revised: 09/17/2015 Document Reviewed: 12/01/2014 Elsevier Interactive Patient Education  2017 [employer].

## 2016-06-23 ENCOUNTER — Ambulatory Visit
Admission: RE | Admit: 2016-06-23 | Discharge: 2016-06-23 | Disposition: A | Discharge status: Home / Self Care | Payer: 59 | Source: Ambulatory Visit | Attending: Family Medicine | Admitting: Family Medicine

## 2016-06-23 DIAGNOSIS — Z1231 Encounter for screening mammogram for malignant neoplasm of breast: Secondary | ICD-10-CM

## 2016-07-05 ENCOUNTER — Ambulatory Visit: Payer: BLUE CROSS/BLUE SHIELD | Admitting: Family Medicine

## 2016-07-26 ENCOUNTER — Encounter: Payer: Self-pay | Admitting: Gynecology

## 2016-07-29 ENCOUNTER — Other Ambulatory Visit: Payer: Self-pay | Admitting: Family Medicine

## 2016-08-08 ENCOUNTER — Other Ambulatory Visit: Payer: Self-pay | Admitting: Family Medicine

## 2016-10-05 ENCOUNTER — Other Ambulatory Visit: Payer: Self-pay | Admitting: Family Medicine

## 2017-01-01 ENCOUNTER — Other Ambulatory Visit: Payer: Self-pay | Admitting: Family Medicine

## 2017-01-30 ENCOUNTER — Other Ambulatory Visit: Payer: Self-pay

## 2017-01-30 MED ORDER — TRIAMTERENE-HCTZ 37.5-25 MG PO TABS: 1.0000 | ORAL_TABLET | Freq: Every day | ORAL | 1 refills | Status: DC

## 2017-02-22 ENCOUNTER — Ambulatory Visit: Payer: Self-pay | Admitting: Family Medicine

## 2017-02-23 ENCOUNTER — Ambulatory Visit: Payer: Self-pay | Admitting: Family Medicine

## 2017-04-23 ENCOUNTER — Other Ambulatory Visit: Payer: Self-pay

## 2017-04-23 MED ORDER — RAMIPRIL 2.5 MG PO CAPS: ORAL_CAPSULE | ORAL | 0 refills | Status: DC

## 2017-05-17 ENCOUNTER — Telehealth: Payer: Self-pay

## 2017-05-17 DIAGNOSIS — E669 Obesity, unspecified: Secondary | ICD-10-CM

## 2017-05-17 DIAGNOSIS — E1169 Type 2 diabetes mellitus with other specified complication: Secondary | ICD-10-CM

## 2017-05-17 DIAGNOSIS — I10 Essential (primary) hypertension: Secondary | ICD-10-CM

## 2017-05-17 NOTE — Telephone Encounter (Signed)
Labs ordered.

## 2017-05-21 LAB — HEMOGLOBIN A1C
HEMOGLOBIN A1C: 7 %{Hb} — AB (ref <5.7)
Mean Plasma Glucose: 154 (calc)
eAG (mmol/L): 8.5 (calc)

## 2017-05-21 LAB — COMPLETE METABOLIC PANEL WITH GFR
AG Ratio: 1.3 (calc) (ref 1.0–2.5)
ALBUMIN MSPROF: 4.3 g/dL (ref 3.6–5.1)
ALKALINE PHOSPHATASE (APISO): 60 U/L (ref 33–130)
ALT: 31 U/L — ABNORMAL HIGH (ref 6–29)
AST: 17 U/L (ref 10–35)
BILIRUBIN TOTAL: 0.7 mg/dL (ref 0.2–1.2)
BUN: 21 mg/dL (ref 7–25)
CO2: 32 mmol/L (ref 20–32)
Calcium: 9.7 mg/dL (ref 8.6–10.4)
Chloride: 101 mmol/L (ref 98–110)
Creat: 0.94 mg/dL (ref 0.50–1.05)
GFR, Est African American: 80 mL/min/{1.73_m2} (ref >=60)
GFR, Est Non African American: 69 mL/min/{1.73_m2} (ref >=60)
GLUCOSE: 99 mg/dL (ref 65–99)
Globulin: 3.2 g/dL (calc) (ref 1.9–3.7)
POTASSIUM: 3.9 mmol/L (ref 3.5–5.3)
Sodium: 141 mmol/L (ref 135–146)
TOTAL PROTEIN: 7.5 g/dL (ref 6.1–8.1)

## 2017-05-21 LAB — CBC
HCT: 39.4 % (ref 35.0–45.0)
Hemoglobin: 13 g/dL (ref 11.7–15.5)
MCH: 29 pg (ref 27.0–33.0)
MCHC: 33 g/dL (ref 32.0–36.0)
MCV: 87.8 fL (ref 80.0–100.0)
MPV: 10.3 fL (ref 7.5–12.5)
PLATELETS: 351 10*3/uL (ref 140–400)
RBC: 4.49 10*6/uL (ref 3.80–5.10)
RDW: 13.4 % (ref 11.0–15.0)
WBC: 6.7 10*3/uL (ref 3.8–10.8)

## 2017-05-21 LAB — LIPID PANEL
Cholesterol: 165 mg/dL (ref <200)
HDL: 42 mg/dL — AB (ref >50)
LDL Cholesterol (Calc): 100 mg/dL (calc) — ABNORMAL HIGH
Non-HDL Cholesterol (Calc): 123 mg/dL (calc) (ref <130)
TRIGLYCERIDES: 124 mg/dL (ref <150)
Total CHOL/HDL Ratio: 3.9 (calc) (ref <5.0)

## 2017-05-21 LAB — TSH: TSH: 0.79 mIU/L

## 2017-05-21 LAB — VITAMIN D 25 HYDROXY (VIT D DEFICIENCY, FRACTURES): Vit D, 25-Hydroxy: 19 ng/mL — ABNORMAL LOW (ref 30–100)

## 2017-05-23 ENCOUNTER — Encounter: Payer: Self-pay | Admitting: Family Medicine

## 2017-05-23 ENCOUNTER — Ambulatory Visit (INDEPENDENT_AMBULATORY_CARE_PROVIDER_SITE_OTHER): Payer: 59 | Admitting: Family Medicine

## 2017-05-23 VITALS — BP 130/82 | HR 99 | Resp 16 | Ht 69.0 in | Wt 199.0 lb

## 2017-05-23 DIAGNOSIS — E663 Overweight: Secondary | ICD-10-CM

## 2017-05-23 DIAGNOSIS — J309 Allergic rhinitis, unspecified: Secondary | ICD-10-CM | POA: Diagnosis not present

## 2017-05-23 DIAGNOSIS — Z13220 Encounter for screening for lipoid disorders: Secondary | ICD-10-CM

## 2017-05-23 DIAGNOSIS — E669 Obesity, unspecified: Secondary | ICD-10-CM | POA: Diagnosis not present

## 2017-05-23 DIAGNOSIS — E785 Hyperlipidemia, unspecified: Secondary | ICD-10-CM | POA: Diagnosis not present

## 2017-05-23 DIAGNOSIS — E1169 Type 2 diabetes mellitus with other specified complication: Secondary | ICD-10-CM | POA: Diagnosis not present

## 2017-05-23 DIAGNOSIS — I10 Essential (primary) hypertension: Secondary | ICD-10-CM

## 2017-05-23 MED ORDER — LORATADINE 10 MG PO TABS: 10.0000 mg | ORAL_TABLET | Freq: Every day | ORAL | 1 refills | Status: DC

## 2017-05-23 NOTE — Patient Instructions (Addendum)
F/u in 6 months, call if you need me before   Please change footwear  Please schedule your Pap and mammogram, also your eye exam and get these done  Continue to Change diet and increase exercise , reduce cheese  And red meat and egg yolk  STOP ramipril  New for allergies is loratidine  Thank you  for choosing [facility]. We consider it a privelige to serve you.  Delivering excellent health care in a caring and  compassionate way is our goal.  Partnering with you,  so that together we can achieve this goal is our strategy.

## 2017-05-24 ENCOUNTER — Other Ambulatory Visit (HOSPITAL_COMMUNITY)
Admission: RE | Admit: 2017-05-24 | Discharge: 2017-05-24 | Disposition: A | Discharge status: Home / Self Care | Payer: 59 | Source: Other Acute Inpatient Hospital | Attending: Family Medicine | Admitting: Family Medicine

## 2017-05-24 DIAGNOSIS — E1169 Type 2 diabetes mellitus with other specified complication: Secondary | ICD-10-CM | POA: Diagnosis present

## 2017-05-25 LAB — MICROALBUMIN / CREATININE URINE RATIO
CREATININE, UR: 175.9 mg/dL
MICROALB UR: 15.6 ug/mL — AB
MICROALB/CREAT RATIO: 8.9 mg/g{creat} (ref 0.0–30.0)

## 2017-05-26 ENCOUNTER — Encounter: Payer: Self-pay | Admitting: Family Medicine

## 2017-05-26 DIAGNOSIS — E785 Hyperlipidemia, unspecified: Secondary | ICD-10-CM | POA: Insufficient documentation

## 2017-05-26 NOTE — Progress Notes (Signed)
Felicia HookJanet Schneider     MRN: [id number]      DOB: 12/23/1965   HPI [name] is here for follow up and re-evaluation of chronic medical conditions, medication management and review of any available recent lab and radiology data.  Preventive health is reviewed and needs updating, this is discussed  And addressed  The PT c/o dry tickle in throat and cough with ramipril, advised her this is likely an allergic reaction and she will now stop the medication.  Increased head congestion and clear nasal drainage noted  There are no specific complaints  Denies polyuria, polydipsia, blurred vision , or hypoglycemic episodes. Exercises twice per week and tests regularly, FBG seldom over 130  ROS Denies recent fever or chills. Denies sinus pressure, , ear pain or sore throat. Denies chest congestion, productive cough or wheezing. Denies chest pains, palpitations and leg swelling Denies abdominal pain, nausea, vomiting,diarrhea or constipation.   Denies dysuria, frequency, hesitancy or incontinence. Denies joint pain, swelling and limitation in mobility. Denies headaches, seizures, numbness, or tingling. Denies depression, anxiety or insomnia. Denies skin break down or rash.   PE  BP 130/82   Pulse 99   Resp 16   Ht 5\' 9"  (1.753 m)   Wt 199 lb (90.3 kg)   LMP 07/29/2014 (Exact Date)   SpO2 94%   BMI 29.39 kg/m   Patient alert and oriented and in no cardiopulmonary distress.  HEENT: No facial asymmetry, EOMI,   oropharynx pink and moist.  Neck supple no JVD, no mass.No sinus tenderness, TM clear  Chest: Clear to auscultation bilaterally.  CVS: S1, S2 no murmurs, no S3.Regular rate.  ABD: Soft non tender.   Ext: No edema  MS: Adequate ROM spine, shoulders, hips and knees.  Skin: Intact, no ulcerations or rash noted.  Psych: Good eye contact, normal affect. Memory intact not anxious or depressed appearing.  CNS: CN 2-12 intact, power,  normal throughout.no focal deficits  noted.   Assessment & Plan  Essential hypertension Controlled, no change in medication DASH diet and commitment to daily physical activity for a minimum of 30 minutes discussed and encouraged, as a part of hypertension management. The importance of attaining a healthy weight is also discussed.  BP/Weight 05/23/2017 05/24/2016 04/11/2016 02/28/2016 10/29/2015 06/14/2015 02/19/2015  Systolic BP 130 117 110 120 124 128 128  Diastolic BP 82 82 78 88 80 78 80  Wt. (Lbs) 199 203.4 203 204.12 201 209 203  BMI 29.39 30.04 29.13 29.29 28.84 29.99 29.13       DM type 2 with diabetic dyslipidemia (HCC) [name] is reminded of the importance of commitment to daily physical activity for 30 minutes or more, as able and the need to limit carbohydrate intake to 30 to 60 grams per meal to help with blood sugar control.   The need to take medication as prescribed, test blood sugar as directed, and to call between visits if there is a concern that blood sugar is uncontrolled is also discussed.   [name] is reminded of the importance of daily foot exam, annual eye examination, and good blood sugar, blood pressure and cholesterol control.  Diabetic Labs Latest Ref Rng & Units 05/24/2017 05/19/2017 05/18/2016 04/11/2016 02/18/2016  HbA1c <5.7 % of total Hgb - 7.0(H) - - 6.9(H)  Microalbumin Not Estab. ug/mL 15.6(H) - - - -  Micro/Creat Ratio 0.0 - 30.0 mg/g creat 8.9 - - - -  Chol <200 mg/dL - 130165 - - -  HDL >  50 mg/dL - 11(B) - - -  Calc LDL mg/dL (calc) - 147(W) - - -  Triglycerides <150 mg/dL - 295 - - -  Creatinine 0.50 - 1.05 mg/dL - 6.21 3.08 6.57(Q) 4.69(G)   BP/Weight 05/23/2017 05/24/2016 04/11/2016 02/28/2016 10/29/2015 06/14/2015 02/19/2015  Systolic BP 130 117 110 120 124 128 128  Diastolic BP 82 82 78 88 80 78 80  Wt. (Lbs) 199 203.4 203 204.12 201 209 203  BMI 29.39 30.04 29.13 29.29 28.84 29.99 29.13   Foot/eye exam completion dates Latest Ref Rng & Units 05/23/2017 11/11/2015  Eye Exam No  Retinopathy - No Retinopathy  Foot Form Completion - Done -      Controlled, no change in medication   Allergic rhinitis Uncontrolled , start daily loratidine  Overweight (BMI 25.0-29.9) improved Patient re-educated about  the importance of commitment to a  minimum of 150 minutes of exercise per week.  The importance of healthy food choices with portion control discussed. Encouraged to start a food diary, count calories and to consider  joining a support group. Sample diet sheets offered. Goals set by the patient for the next several months.   Weight /BMI 05/23/2017 05/24/2016 04/11/2016  WEIGHT 199 lb 203 lb 6.4 oz 203 lb  HEIGHT 5\' 9"  5\' 9"  5\' 10"   BMI 29.39 kg/m2 30.04 kg/m2 29.13 kg/m2      Dyslipidemia, goal LDL below 100 Hyperlipidemia:Low fat diet discussed and encouraged.   Lipid Panel  Lab Results  Component Value Date   CHOL 165 05/19/2017   HDL 42 (L) 05/19/2017   LDLCALC 100 (H) 05/19/2017   TRIG 124 05/19/2017   CHOLHDL 3.9 05/19/2017     Needs to increase exercise and reduce fat intake Statin indicated as a diabetic nut holding on this currently Updated lab needed at/ before next visit.

## 2017-05-26 NOTE — Assessment & Plan Note (Signed)
Hyperlipidemia:Low fat diet discussed and encouraged.   Lipid Panel  Lab Results  Component Value Date   CHOL 165 05/19/2017   HDL 42 (L) 05/19/2017   LDLCALC 100 (H) 05/19/2017   TRIG 124 05/19/2017   CHOLHDL 3.9 05/19/2017     Needs to increase exercise and reduce fat intake Statin indicated as a diabetic nut holding on this currently Updated lab needed at/ before next visit.

## 2017-05-26 NOTE — Assessment & Plan Note (Signed)
improved Patient re-educated about  the importance of commitment to a  minimum of 150 minutes of exercise per week.  The importance of healthy food choices with portion control discussed. Encouraged to start a food diary, count calories and to consider  joining a support group. Sample diet sheets offered. Goals set by the patient for the next several months.   Weight /BMI 05/23/2017 05/24/2016 04/11/2016  WEIGHT 199 lb 203 lb 6.4 oz 203 lb  HEIGHT 5\' 9"  5\' 9"  5\' 10"   BMI 29.39 kg/m2 30.04 kg/m2 29.13 kg/m2

## 2017-05-26 NOTE — Assessment & Plan Note (Signed)
Controlled, no change in medication DASH diet and commitment to daily physical activity for a minimum of 30 minutes discussed and encouraged, as a part of hypertension management. The importance of attaining a healthy weight is also discussed.  BP/Weight 05/23/2017 05/24/2016 04/11/2016 02/28/2016 10/29/2015 06/14/2015 02/19/2015  Systolic BP 130 117 110 120 124 128 128  Diastolic BP 82 82 78 88 80 78 80  Wt. (Lbs) 199 203.4 203 204.12 201 209 203  BMI 29.39 30.04 29.13 29.29 28.84 29.99 29.13

## 2017-05-26 NOTE — Assessment & Plan Note (Signed)
Uncontrolled ,start daily loratidine 

## 2017-05-26 NOTE — Assessment & Plan Note (Signed)
[name] is reminded of the importance of commitment to daily physical activity for 30 minutes or more, as able and the need to limit carbohydrate intake to 30 to 60 grams per meal to help with blood sugar control.   The need to take medication as prescribed, test blood sugar as directed, and to call between visits if there is a concern that blood sugar is uncontrolled is also discussed.   [name] is reminded of the importance of daily foot exam, annual eye examination, and good blood sugar, blood pressure and cholesterol control.  Diabetic Labs Latest Ref Rng & Units 05/24/2017 05/19/2017 05/18/2016 04/11/2016 02/18/2016  HbA1c <5.7 % of total Hgb - 7.0(H) - - 6.9(H)  Microalbumin Not Estab. ug/mL 15.6(H) - - - -  Micro/Creat Ratio 0.0 - 30.0 mg/g creat 8.9 - - - -  Chol <200 mg/dL - 914165 - - -  HDL >78>50 mg/dL - 29(F42(L) - - -  Calc LDL mg/dL (calc) - 621(H100(H) - - -  Triglycerides <150 mg/dL - 086124 - - -  Creatinine 0.50 - 1.05 mg/dL - 5.780.94 4.691.04 6.29(B1.24(H) 2.84(X1.08(H)   BP/Weight 05/23/2017 05/24/2016 04/11/2016 02/28/2016 10/29/2015 06/14/2015 02/19/2015  Systolic BP 130 117 110 120 124 128 128  Diastolic BP 82 82 78 88 80 78 80  Wt. (Lbs) 199 203.4 203 204.12 201 209 203  BMI 29.39 30.04 29.13 29.29 28.84 29.99 29.13   Foot/eye exam completion dates Latest Ref Rng & Units 05/23/2017 11/11/2015  Eye Exam No Retinopathy - No Retinopathy  Foot Form Completion - Done -      Controlled, no change in medication

## 2017-07-06 ENCOUNTER — Other Ambulatory Visit: Payer: Self-pay | Admitting: Family Medicine

## 2017-07-06 DIAGNOSIS — Z1231 Encounter for screening mammogram for malignant neoplasm of breast: Secondary | ICD-10-CM

## 2017-07-22 ENCOUNTER — Other Ambulatory Visit: Payer: Self-pay | Admitting: Family Medicine

## 2017-07-25 ENCOUNTER — Other Ambulatory Visit: Payer: Self-pay | Admitting: Family Medicine

## 2017-08-07 ENCOUNTER — Other Ambulatory Visit: Payer: Self-pay

## 2017-08-07 ENCOUNTER — Telehealth: Payer: Self-pay | Admitting: Family Medicine

## 2017-08-07 MED ORDER — METFORMIN HCL 500 MG PO TABS: 500.0000 mg | ORAL_TABLET | Freq: Three times a day (TID) | ORAL | 1 refills | Status: DC

## 2017-08-07 NOTE — Telephone Encounter (Signed)
Done

## 2017-08-07 NOTE — Telephone Encounter (Signed)
Pt is calling in requesting Metformin to be sent in to [facility]

## 2017-08-09 ENCOUNTER — Other Ambulatory Visit: Payer: Self-pay | Admitting: Family Medicine

## 2017-08-09 ENCOUNTER — Encounter: Payer: Self-pay | Admitting: Family Medicine

## 2017-08-09 MED ORDER — METFORMIN HCL 500 MG PO TABS: ORAL_TABLET | ORAL | 3 refills | Status: DC

## 2017-08-09 MED ORDER — TRIAMTERENE-HCTZ 37.5-25 MG PO TABS
1.0000 | ORAL_TABLET | Freq: Every day | ORAL | 3 refills | Status: DC
Start: 2017-08-09 — End: 2018-09-03

## 2017-09-05 ENCOUNTER — Encounter: Payer: Self-pay | Admitting: Women's Health

## 2017-09-05 ENCOUNTER — Ambulatory Visit: Payer: 59

## 2017-09-05 ENCOUNTER — Ambulatory Visit (INDEPENDENT_AMBULATORY_CARE_PROVIDER_SITE_OTHER): Payer: 59 | Admitting: Women's Health

## 2017-09-05 VITALS — BP 120/84 | Ht 69.0 in | Wt 210.0 lb

## 2017-09-05 DIAGNOSIS — N898 Other specified noninflammatory disorders of vagina: Secondary | ICD-10-CM

## 2017-09-05 DIAGNOSIS — B9689 Other specified bacterial agents as the cause of diseases classified elsewhere: Secondary | ICD-10-CM

## 2017-09-05 DIAGNOSIS — Z01419 Encounter for gynecological examination (general) (routine) without abnormal findings: Secondary | ICD-10-CM | POA: Diagnosis not present

## 2017-09-05 DIAGNOSIS — N760 Acute vaginitis: Secondary | ICD-10-CM | POA: Diagnosis not present

## 2017-09-05 LAB — WET PREP FOR TRICH, YEAST, CLUE

## 2017-09-05 MED ORDER — METRONIDAZOLE 500 MG PO TABS: 500.0000 mg | ORAL_TABLET | Freq: Two times a day (BID) | ORAL | 0 refills | Status: DC

## 2017-09-05 NOTE — Progress Notes (Signed)
Felicia Schneider 10/20/1965 [id number]    History:    Presents for annual exam.  Is menopausal on no HRT with no bleeding.  Normal Pap and mammogram history.  2015- colonoscopy 5-year follow-up mother colon cancer survivor.  Primary care manages hypertension, diabetes and hypercholesteremia.  Without complaint today.  Past medical history, past surgical history, family history and social history were all reviewed and documented in the EPIC chart.  Works at [employer].  Son 323, daughter [id number] both doing well.  Mother hypertension, diabetes.  ROS:  A ROS was performed and pertinent positives and negatives are included.  Exam:  Vitals:   09/05/17 1055  BP: 120/84  Weight: 210 lb (95.3 kg)  Height: 5\' 9"  (1.753 m)   Body mass index is 31.01 kg/m.   General appearance:  Normal Thyroid:  Symmetrical, normal in size, without palpable masses or nodularity. Respiratory  Auscultation:  Clear without wheezing or rhonchi Cardiovascular  Auscultation:  Regular rate, without rubs, murmurs or gallops  Edema/varicosities:  Not grossly evident Abdominal  Soft,nontender, without masses, guarding or rebound.  Liver/spleen:  No organomegaly noted  Hernia:  None appreciated  Skin  Inspection:  Grossly normal   Breasts: Examined lying and sitting.     Right: Without masses, retractions, discharge or axillary adenopathy.     Left: Without masses, retractions, discharge or axillary adenopathy. Gentitourinary   Inguinal/mons:  Normal without inguinal adenopathy  External genitalia:  Normal  BUS/Urethra/Skene's glands:  Normal  Vagina: Milky discharge with odor, wet prep positive for clues, TNTC bacteria  Cervix:  Normal  Uterus:   normal in size, shape and contour.  Midline and mobile  Adnexa/parametria:     Rt: Without masses or tenderness.   Lt: Without masses or tenderness.  Anus and perineum: Normal  Digital rectal exam: Normal sphincter tone without palpated masses or  tenderness  Assessment/Plan:  52 y.o. MPF G2 P2 for annual exam with no complaints.  Postmenopausal/no HRT/no bleeding Bacterial vaginosis Hypertension, diabetes, primary care manages labs and meds Obesity  Plan: Flagyl 500 twice daily for 7 days, alcohol precautions reviewed.  SBE's, continue annual screening mammogram has scheduled, calcium rich foods, vitamin D 2000 daily encouraged.  Reviewed importance of increasing exercise and decreasing calorie/carbs.  Has available treadmill working desk at work encouraged to use.  Pap with HR HPV typing, new screening guidelines reviewed.    Harrington Challengerancy J Young [facility] CenterWHNP, 11:32 AM 09/05/2017

## 2017-09-05 NOTE — Addendum Note (Signed)
Addended by: Tito DineBONHAM, KIM A on: 09/05/2017 11:42 AM   Modules accepted: Orders

## 2017-09-05 NOTE — Patient Instructions (Signed)
Health Maintenance for Postmenopausal Women Menopause is a normal process in which your reproductive ability comes to an end. This process happens gradually over a span of months to years, usually between the ages of 22 and 9. Menopause is complete when you have missed 12 consecutive menstrual periods. It is important to talk with your health care provider about some of the most common conditions that affect postmenopausal women, such as heart disease, cancer, and bone loss (osteoporosis). Adopting a healthy lifestyle and getting preventive care can help to promote your health and wellness. Those actions can also lower your chances of developing some of these common conditions. What should I know about menopause? During menopause, you may experience a number of symptoms, such as:  Moderate-to-severe hot flashes.  Night sweats.  Decrease in sex drive.  Mood swings.  Headaches.  Tiredness.  Irritability.  Memory problems.  Insomnia.  Choosing to treat or not to treat menopausal changes is an individual decision that you make with your health care provider. What should I know about hormone replacement therapy and supplements? Hormone therapy products are effective for treating symptoms that are associated with menopause, such as hot flashes and night sweats. Hormone replacement carries certain risks, especially as you become older. If you are thinking about using estrogen or estrogen with progestin treatments, discuss the benefits and risks with your health care provider. What should I know about heart disease and stroke? Heart disease, heart attack, and stroke become more likely as you age. This may be due, in part, to the hormonal changes that your body experiences during menopause. These can affect how your body processes dietary fats, triglycerides, and cholesterol. Heart attack and stroke are both medical emergencies. There are many things that you can do to help prevent heart disease  and stroke:  Have your blood pressure checked at least every 1-2 years. High blood pressure causes heart disease and increases the risk of stroke.  If you are 53-22 years old, ask your health care provider if you should take aspirin to prevent a heart attack or a stroke.  Do not use any tobacco products, including cigarettes, chewing tobacco, or electronic cigarettes. If you need help quitting, ask your health care provider.  It is important to eat a healthy diet and maintain a healthy weight. ? Be sure to include plenty of vegetables, fruits, low-fat dairy products, and lean protein. ? Avoid eating foods that are high in solid fats, added sugars, or salt (sodium).  Get regular exercise. This is one of the most important things that you can do for your health. ? Try to exercise for at least 150 minutes each week. The type of exercise that you do should increase your heart rate and make you sweat. This is known as moderate-intensity exercise. ? Try to do strengthening exercises at least twice each week. Do these in addition to the moderate-intensity exercise.  Know your numbers.Ask your health care provider to check your cholesterol and your blood glucose. Continue to have your blood tested as directed by your health care provider.  What should I know about cancer screening? There are several types of cancer. Take the following steps to reduce your risk and to catch any cancer development as early as possible. Breast Cancer  Practice breast self-awareness. ? This means understanding how your breasts normally appear and feel. ? It also means doing regular breast self-exams. Let your health care provider know about any changes, no matter how small.  If you are 40  or older, have a clinician do a breast exam (clinical breast exam or CBE) every year. Depending on your age, family history, and medical history, it may be recommended that you also have a yearly breast X-ray (mammogram).  If you  have a family history of breast cancer, talk with your health care provider about genetic screening.  If you are at high risk for breast cancer, talk with your health care provider about having an MRI and a mammogram every year.  Breast cancer (BRCA) gene test is recommended for women who have family members with BRCA-related cancers. Results of the assessment will determine the need for genetic counseling and BRCA1 and for BRCA2 testing. BRCA-related cancers include these types: ? Breast. This occurs in males or females. ? Ovarian. ? Tubal. This may also be called fallopian tube cancer. ? Cancer of the abdominal or pelvic lining (peritoneal cancer). ? Prostate. ? Pancreatic.  Cervical, Uterine, and Ovarian Cancer Your health care provider may recommend that you be screened regularly for cancer of the pelvic organs. These include your ovaries, uterus, and vagina. This screening involves a pelvic exam, which includes checking for microscopic changes to the surface of your cervix (Pap test).  For women ages 21-65, health care providers may recommend a pelvic exam and a Pap test every three years. For women ages 79-65, they may recommend the Pap test and pelvic exam, combined with testing for human papilloma virus (HPV), every five years. Some types of HPV increase your risk of cervical cancer. Testing for HPV may also be done on women of any age who have unclear Pap test results.  Other health care providers may not recommend any screening for nonpregnant women who are considered low risk for pelvic cancer and have no symptoms. Ask your health care provider if a screening pelvic exam is right for you.  If you have had past treatment for cervical cancer or a condition that could lead to cancer, you need Pap tests and screening for cancer for at least 20 years after your treatment. If Pap tests have been discontinued for you, your risk factors (such as having a new sexual partner) need to be  reassessed to determine if you should start having screenings again. Some women have medical problems that increase the chance of getting cervical cancer. In these cases, your health care provider may recommend that you have screening and Pap tests more often.  If you have a family history of uterine cancer or ovarian cancer, talk with your health care provider about genetic screening.  If you have vaginal bleeding after reaching menopause, tell your health care provider.  There are currently no reliable tests available to screen for ovarian cancer.  Lung Cancer Lung cancer screening is recommended for adults 69-62 years old who are at high risk for lung cancer because of a history of smoking. A yearly low-dose CT scan of the lungs is recommended if you:  Currently smoke.  Have a history of at least 30 pack-years of smoking and you currently smoke or have quit within the past 15 years. A pack-year is smoking an average of one pack of cigarettes per day for one year.  Yearly screening should:  Continue until it has been 15 years since you quit.  Stop if you develop a health problem that would prevent you from having lung cancer treatment.  Colorectal Cancer  This type of cancer can be detected and can often be prevented.  Routine colorectal cancer screening usually begins at  age 42 and continues through age 45.  If you have risk factors for colon cancer, your health care provider may recommend that you be screened at an earlier age.  If you have a family history of colorectal cancer, talk with your health care provider about genetic screening.  Your health care provider may also recommend using home test kits to check for hidden blood in your stool.  A small camera at the end of a tube can be used to examine your colon directly (sigmoidoscopy or colonoscopy). This is done to check for the earliest forms of colorectal cancer.  Direct examination of the colon should be repeated every  5-10 years until age 71. However, if early forms of precancerous polyps or small growths are found or if you have a family history or genetic risk for colorectal cancer, you may need to be screened more often.  Skin Cancer  Check your skin from head to toe regularly.  Monitor any moles. Be sure to tell your health care provider: ? About any new moles or changes in moles, especially if there is a change in a mole's shape or color. ? If you have a mole that is larger than the size of a pencil eraser.  If any of your family members has a history of skin cancer, especially at a Felicia Schneider age, talk with your health care provider about genetic screening.  Always use sunscreen. Apply sunscreen liberally and repeatedly throughout the day.  Whenever you are outside, protect yourself by wearing long sleeves, pants, a wide-brimmed hat, and sunglasses.  What should I know about osteoporosis? Osteoporosis is a condition in which bone destruction happens more quickly than new bone creation. After menopause, you may be at an increased risk for osteoporosis. To help prevent osteoporosis or the bone fractures that can happen because of osteoporosis, the following is recommended:  If you are 46-71 years old, get at least 1,000 mg of calcium and at least 600 mg of vitamin D per day.  If you are older than age 55 but younger than age 65, get at least 1,200 mg of calcium and at least 600 mg of vitamin D per day.  If you are older than age 54, get at least 1,200 mg of calcium and at least 800 mg of vitamin D per day.  Smoking and excessive alcohol intake increase the risk of osteoporosis. Eat foods that are rich in calcium and vitamin D, and do weight-bearing exercises several times each week as directed by your health care provider. What should I know about how menopause affects my mental health? Depression may occur at any age, but it is more common as you become older. Common symptoms of depression  include:  Low or sad mood.  Changes in sleep patterns.  Changes in appetite or eating patterns.  Feeling an overall lack of motivation or enjoyment of activities that you previously enjoyed.  Frequent crying spells.  Talk with your health care provider if you think that you are experiencing depression. What should I know about immunizations? It is important that you get and maintain your immunizations. These include:  Tetanus, diphtheria, and pertussis (Tdap) booster vaccine.  Influenza every year before the flu season begins.  Pneumonia vaccine.  Shingles vaccine.  Your health care provider may also recommend other immunizations. This information is not intended to replace advice given to you by your health care provider. Make sure you discuss any questions you have with your health care provider. Document Released: 04/21/2005  Document Revised: 09/17/2015 Document Reviewed: 12/01/2014 Elsevier Interactive Patient Education  2018 Elsevier Inc.  

## 2017-09-07 LAB — URINE CULTURE
MICRO NUMBER:: 90768707
SPECIMEN QUALITY:: ADEQUATE

## 2017-09-07 LAB — URINALYSIS, COMPLETE W/RFL CULTURE
Bacteria, UA: NONE SEEN /HPF
Bilirubin Urine: NEGATIVE
GLUCOSE, UA: NEGATIVE
HYALINE CAST: NONE SEEN /LPF
Ketones, ur: NEGATIVE
Leukocyte Esterase: NEGATIVE
Nitrites, Initial: NEGATIVE
PROTEIN: NEGATIVE
Specific Gravity, Urine: 1.018 (ref 1.001–1.03)
pH: 6 (ref 5.0–8.0)

## 2017-09-07 LAB — CULTURE INDICATED

## 2017-09-07 LAB — PAP, TP IMAGING W/ HPV RNA, RFLX HPV TYPE 16,18/45: HPV DNA HIGH RISK: NOT DETECTED

## 2017-09-28 ENCOUNTER — Ambulatory Visit
Admission: RE | Admit: 2017-09-28 | Discharge: 2017-09-28 | Disposition: A | Discharge status: Home / Self Care | Payer: 59 | Source: Ambulatory Visit | Attending: Family Medicine | Admitting: Family Medicine

## 2017-09-28 DIAGNOSIS — Z1231 Encounter for screening mammogram for malignant neoplasm of breast: Secondary | ICD-10-CM

## 2017-10-17 ENCOUNTER — Ambulatory Visit: Payer: 59 | Admitting: Family Medicine

## 2017-10-25 ENCOUNTER — Ambulatory Visit: Payer: 59 | Admitting: Family Medicine

## 2018-03-14 IMAGING — DX DG CHEST 2V
2 series · 2 of 2 positions shown · non-contrast
Comparison: None.

CLINICAL DATA: Fever, chills for 4 days

EXAM:
CHEST  2 VIEW

[chest pa]
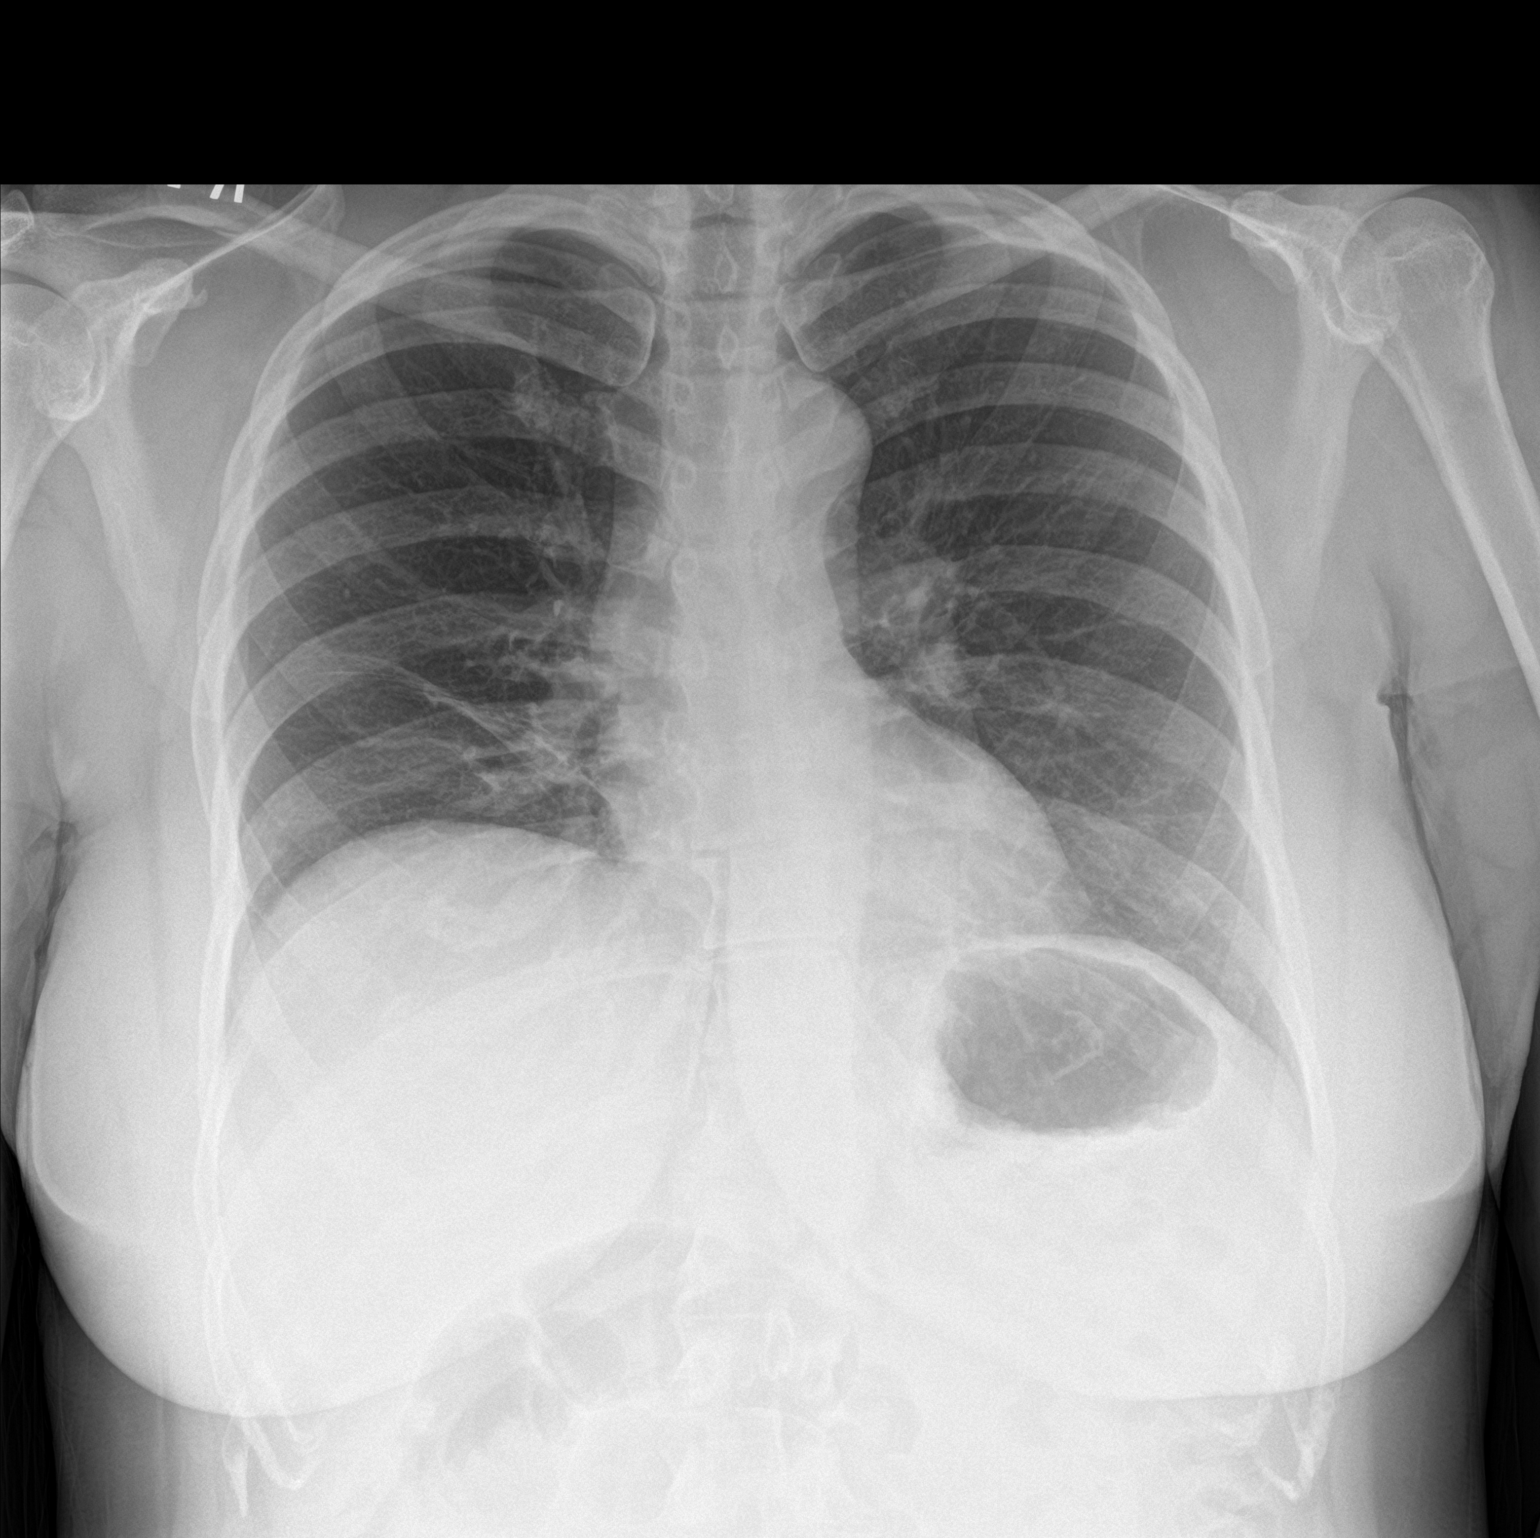

[chest lat]
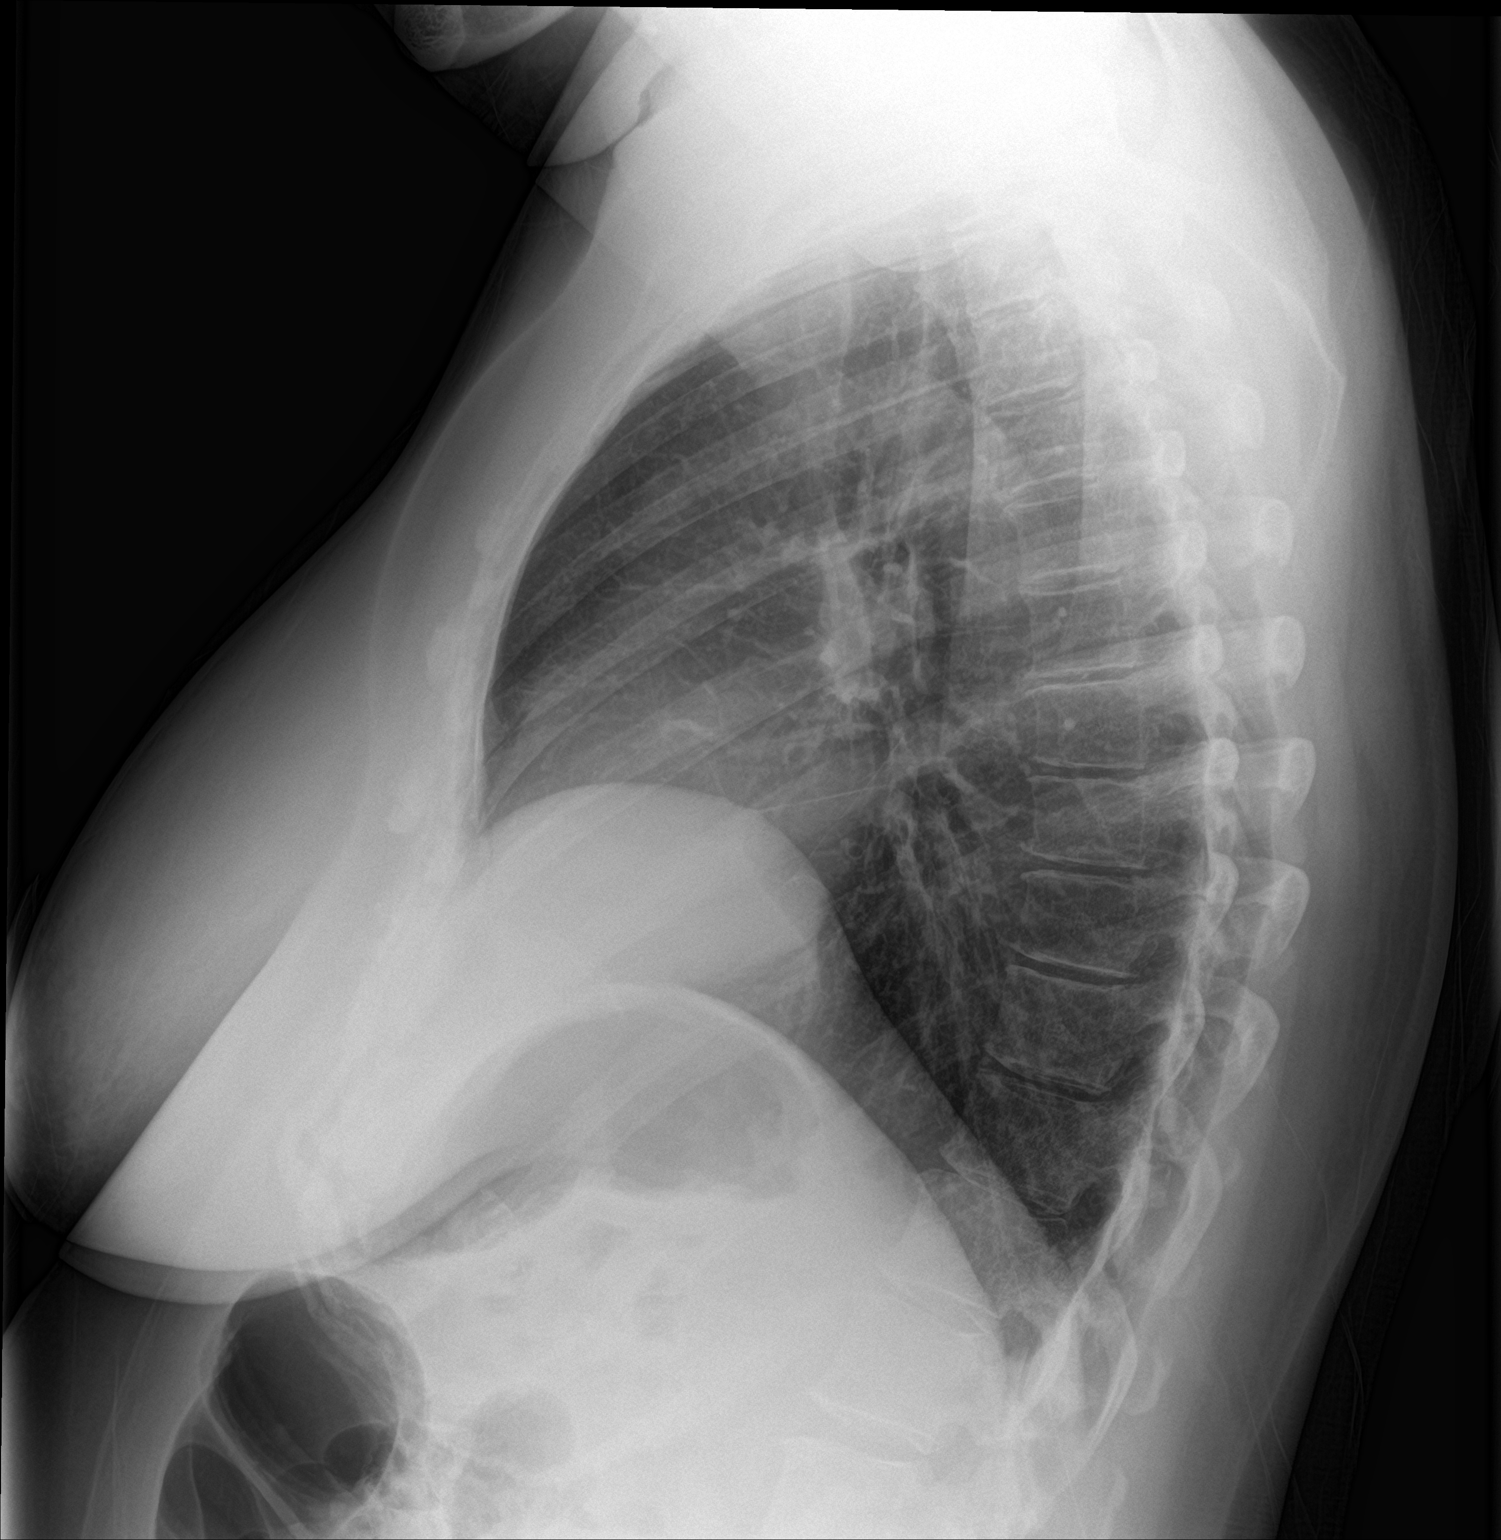

[2 of 2 positions shown; findings below may reference images not displayed]

FINDINGS: Linear subsegmental atelectasis at the right base. Mild elevation of
the right hemidiaphragm. Left lung is clear. Heart is normal size.
No effusions or acute bony abnormality.
IMPRESSION: Right basilar subsegmental atelectasis.

## 2018-07-28 ENCOUNTER — Other Ambulatory Visit: Payer: Self-pay | Admitting: Family Medicine

## 2018-07-30 ENCOUNTER — Telehealth: Payer: Self-pay

## 2018-07-30 DIAGNOSIS — I10 Essential (primary) hypertension: Secondary | ICD-10-CM

## 2018-07-30 DIAGNOSIS — E663 Overweight: Secondary | ICD-10-CM

## 2018-07-30 DIAGNOSIS — E1169 Type 2 diabetes mellitus with other specified complication: Secondary | ICD-10-CM

## 2018-07-30 DIAGNOSIS — Z13220 Encounter for screening for lipoid disorders: Secondary | ICD-10-CM

## 2018-07-30 DIAGNOSIS — E785 Hyperlipidemia, unspecified: Secondary | ICD-10-CM

## 2018-07-30 DIAGNOSIS — Z1321 Encounter for screening for nutritional disorder: Secondary | ICD-10-CM

## 2018-07-30 NOTE — Telephone Encounter (Signed)
-----  Message from Parmele [facility] sent at 07/30/2018  9:58 AM EDT ----- Regarding: RE: pls call! Appointment made, patient will go a week prior to her appointment. Please order labs at [facility]   ----- Message ----- From: Fayrene Helper, MD Sent: 07/28/2018   7:07 PM EDT To: Eual Fines, LPN, [facility] A [facility] Subject: pls call!                                      Pls call, no appt and multiple care gaps, needs f/u in office for hTN and diabetes, also offer shingrix during the visit for visit H Needs labs prior at least 1 week, and the ar overdue  Brandi, Labs needed are fasting lipid, cmp and EGFr, CBC, hBA1C,TSH, vit D and microalb Thank you both!

## 2018-07-30 NOTE — Telephone Encounter (Signed)
Labs ordered.

## 2018-08-14 ENCOUNTER — Other Ambulatory Visit: Payer: Self-pay | Admitting: Family Medicine

## 2018-08-29 ENCOUNTER — Encounter: Payer: Self-pay | Admitting: Family Medicine

## 2018-08-29 LAB — MICROALBUMIN, URINE: Microalb, Ur: 1.1 mg/dL

## 2018-08-29 LAB — COMPLETE METABOLIC PANEL WITH GFR
AG Ratio: 1.4 (calc) (ref 1.0–2.5)
ALT: 41 U/L — ABNORMAL HIGH (ref 6–29)
AST: 23 U/L (ref 10–35)
Albumin: 4.2 g/dL (ref 3.6–5.1)
Alkaline phosphatase (APISO): 52 U/L (ref 37–153)
BUN/Creatinine Ratio: 22 (calc) (ref 6–22)
BUN: 24 mg/dL (ref 7–25)
CO2: 26 mmol/L (ref 20–32)
Calcium: 10 mg/dL (ref 8.6–10.4)
Chloride: 102 mmol/L (ref 98–110)
Creat: 1.09 mg/dL — ABNORMAL HIGH (ref 0.50–1.05)
GFR, Est African American: 66 mL/min/{1.73_m2} (ref >=60)
GFR, Est Non African American: 57 mL/min/{1.73_m2} — ABNORMAL LOW (ref >=60)
Globulin: 3 g/dL (calc) (ref 1.9–3.7)
Glucose, Bld: 186 mg/dL — ABNORMAL HIGH (ref 65–99)
Potassium: 4.2 mmol/L (ref 3.5–5.3)
Sodium: 142 mmol/L (ref 135–146)
Total Bilirubin: 0.6 mg/dL (ref 0.2–1.2)
Total Protein: 7.2 g/dL (ref 6.1–8.1)

## 2018-08-29 LAB — CBC
HCT: 39.1 % (ref 35.0–45.0)
Hemoglobin: 13 g/dL (ref 11.7–15.5)
MCH: 29.4 pg (ref 27.0–33.0)
MCHC: 33.2 g/dL (ref 32.0–36.0)
MCV: 88.5 fL (ref 80.0–100.0)
MPV: 10.7 fL (ref 7.5–12.5)
Platelets: 350 10*3/uL (ref 140–400)
RBC: 4.42 10*6/uL (ref 3.80–5.10)
RDW: 13.4 % (ref 11.0–15.0)
WBC: 6.4 10*3/uL (ref 3.8–10.8)

## 2018-08-29 LAB — LIPID PANEL
Cholesterol: 180 mg/dL (ref <200)
HDL: 43 mg/dL — ABNORMAL LOW (ref >=50)
LDL Cholesterol (Calc): 106 mg/dL (calc) — ABNORMAL HIGH
Non-HDL Cholesterol (Calc): 137 mg/dL (calc) — ABNORMAL HIGH (ref <130)
Total CHOL/HDL Ratio: 4.2 (calc) (ref <5.0)
Triglycerides: 190 mg/dL — ABNORMAL HIGH (ref <150)

## 2018-08-29 LAB — TSH: TSH: 1.34 mIU/L

## 2018-08-29 LAB — HEMOGLOBIN A1C
Hgb A1c MFr Bld: 9.6 % of total Hgb — ABNORMAL HIGH (ref <5.7)
Mean Plasma Glucose: 229 (calc)
eAG (mmol/L): 12.7 (calc)

## 2018-08-29 LAB — VITAMIN D 25 HYDROXY (VIT D DEFICIENCY, FRACTURES): Vit D, 25-Hydroxy: 34 ng/mL (ref 30–100)

## 2018-09-02 ENCOUNTER — Ambulatory Visit: Payer: 59 | Admitting: Family Medicine

## 2018-09-03 ENCOUNTER — Encounter: Payer: Self-pay | Admitting: Family Medicine

## 2018-09-03 ENCOUNTER — Ambulatory Visit (INDEPENDENT_AMBULATORY_CARE_PROVIDER_SITE_OTHER): Payer: 59 | Admitting: Family Medicine

## 2018-09-03 ENCOUNTER — Other Ambulatory Visit: Payer: Self-pay

## 2018-09-03 VITALS — BP 142/90 | HR 81 | Temp 97.8°F | Resp 15 | Ht 69.0 in | Wt 201.0 lb

## 2018-09-03 DIAGNOSIS — E785 Hyperlipidemia, unspecified: Secondary | ICD-10-CM | POA: Diagnosis not present

## 2018-09-03 DIAGNOSIS — E1169 Type 2 diabetes mellitus with other specified complication: Secondary | ICD-10-CM

## 2018-09-03 DIAGNOSIS — E663 Overweight: Secondary | ICD-10-CM

## 2018-09-03 DIAGNOSIS — I10 Essential (primary) hypertension: Secondary | ICD-10-CM

## 2018-09-03 LAB — GLUCOSE, POCT (MANUAL RESULT ENTRY): POC Glucose: 160 mg/dl — AB (ref 70–99)

## 2018-09-03 MED ORDER — ONETOUCH VERIO VI STRP: ORAL_STRIP | 11 refills | Status: AC

## 2018-09-03 MED ORDER — TRIAMTERENE-HCTZ 37.5-25 MG PO TABS: ORAL_TABLET | ORAL | 3 refills | Status: DC

## 2018-09-03 MED ORDER — ASPIRIN EC 81 MG PO TBEC: 81.0000 mg | DELAYED_RELEASE_TABLET | Freq: Every day | ORAL | 3 refills | Status: DC

## 2018-09-03 MED ORDER — SITAGLIP PHOS-METFORMIN HCL ER 50-1000 MG PO TB24: 1.0000 | ORAL_TABLET | Freq: Two times a day (BID) | ORAL | 3 refills | Status: DC

## 2018-09-03 MED ORDER — ROSUVASTATIN CALCIUM 5 MG PO TABS: 5.0000 mg | ORAL_TABLET | Freq: Every day | ORAL | 1 refills | Status: DC

## 2018-09-03 NOTE — Patient Instructions (Addendum)
F/uSsept 28 or shortly after, call if you need me sooner  New medication in place of metformin is janumet 50/1000 tWO daily  New is crestor 5 mg daily for cholesterol and heart health New is aspirin 81 mg one daily  Blood pressure med is increased to one and a half daily  Commit to exercise for three 10 minute session after each meal and then a after work another 20 to 30 mins for destress and health  You are being referred to diabetic educator where you live  Foot exam is normal  You have early carpal tunnel syndrome clinically  Goal for fasting blood sugar ranges from 80 to 120 and 2 hours after any meal or at bedtime should be between 130 to 170.sEnd results by pt message every Thursday please  NON fasting hBA1C, cmp and eGFR to be drawn 9/26 or shortly after  Thanks for choosing [facility], we consider it a privelige to serve you. Social distancing. Frequent hand washing with soap and water Keeping your hands off of your face. These 3 practices will help to keep both you and your community healthy during this time. Please practice them faithfully!

## 2018-09-03 NOTE — Progress Notes (Signed)
Felicia HookJanet Schneider     MRN: [id number]      DOB: 04/03/1965   HPI Felicia Schneider is here for follow up and re-evaluation of chronic medical conditions, medication management and review of any available recent lab and radiology data.  Preventive health is updated, specifically  Cancer screening and Immunization.   Questions or concerns regarding consultations or procedures which the PT has had in the interim are  addressed. The PT denies any adverse reactions to current medications since the last visit.  There are no new concerns.  There are no specific complaints   ROS Denies recent fever or chills. Denies sinus pressure, nasal congestion, ear pain or sore throat. Denies chest congestion, productive cough or wheezing. Denies chest pains, palpitations and leg swelling Denies abdominal pain, nausea, vomiting,diarrhea or constipation.   Denies dysuria, frequency, hesitancy or incontinence. Denies joint pain, swelling and limitation in mobility. Denies headaches, seizures, numbness, or tingling. Denies depression, anxiety or insomnia. Denies skin break down or rash. Denies polyuria, polydipsia, blurred vision , or hypoglycemic episodes. Has not been testing C/o bilateral hand discomfort with numbness and tingling    PE  BP (!) 142/90   Pulse 81   Temp 97.8 F (36.6 C) (Temporal)   Resp 15   Ht 5\' 9"  (1.753 m)   Wt 201 lb (91.2 kg)   LMP 07/29/2014 (Exact Date)   SpO2 97%   BMI 29.68 kg/m   Patient alert and oriented and in no cardiopulmonary distress.  HEENT: No facial asymmetry, EOMI,   oropharynx pink and moist.  Neck supple no JVD, no mass.  Chest: Clear to auscultation bilaterally.  CVS: S1, S2 no murmurs, no S3.Regular rate.  ABD: Soft non tender.   Ext: No edema  MS: Adequate ROM spine, shoulders, hips and knees. Positive tinel's left greater than right, with mild thenar wasting  Skin: Intact, no ulcerations or rash noted.  Psych: Good eye contact, normal affect.  Memory intact not anxious or depressed appearing.  CNS: CN 2-12 intact, power,  normal throughout.no focal deficits noted.   Assessment & Plan  Essential hypertension UnControlled,increase med dose to 1.5 tabs daily DASH diet and commitment to daily physical activity for a minimum of 30 minutes discussed and encouraged, as a part of hypertension management. The importance of attaining a healthy weight is also discussed.  BP/Weight 09/03/2018 09/05/2017 05/23/2017 05/24/2016 04/11/2016 02/28/2016 10/29/2015  Systolic BP 142 120 130 117 110 120 124  Diastolic BP 90 84 82 82 78 88 80  Wt. (Lbs) 201 210 199 203.4 203 204.12 201  BMI 29.68 31.01 29.39 30.04 29.13 29.29 28.84       DM type 2 with diabetic dyslipidemia (HCC) Uncontrolled, markedly, refer to diabetic ed, start once daily testing, send weekly report Change to janumet Commit to xercise Felicia Schneider is reminded of the importance of commitment to daily physical activity for 30 minutes or more, as able and the need to limit carbohydrate intake to 30 to 60 grams per meal to help with blood sugar control.   The need to take medication as prescribed, test blood sugar as directed, and to call between visits if there is a concern that blood sugar is uncontrolled is also discussed.   Felicia Schneider is reminded of the importance of daily foot exam, annual eye examination, and good blood sugar, blood pressure and cholesterol control.  Diabetic Labs Latest Ref Rng & Units 08/28/2018 05/24/2017 05/19/2017 05/18/2016 04/11/2016  HbA1c <5.7 % of total Hgb  9.6(H) - 7.0(H) - -  Microalbumin mg/dL 1.1 15.6(H) - - -  Micro/Creat Ratio 0.0 - 30.0 mg/g creat - 8.9 - - -  Chol <200 mg/dL 180 - 165 - -  HDL > OR = 50 mg/dL 43(L) - 42(L) - -  Calc LDL mg/dL (calc) 106(H) - 100(H) - -  Triglycerides <150 mg/dL 190(H) - 124 - -  Creatinine 0.50 - 1.05 mg/dL 1.09(H) - 0.94 1.04 1.24(H)   BP/Weight 09/03/2018 09/05/2017 05/23/2017 05/24/2016 04/11/2016 02/28/2016  9/37/1696  Systolic BP 789 381 017 510 258 527 782  Diastolic BP 90 84 82 82 78 88 80  Wt. (Lbs) 201 210 199 203.4 203 204.12 201  BMI 29.68 31.01 29.39 30.04 29.13 29.29 28.84   Foot/eye exam completion dates Latest Ref Rng & Units 09/03/2018 05/23/2017  Eye Exam No Retinopathy - -  Foot Form Completion - Done Done        Dyslipidemia, goal LDL below 100 Uncontrolled , start low dose sattin Hyperlipidemia:Low fat diet discussed and encouraged.   Lipid Panel  Lab Results  Component Value Date   CHOL 180 08/28/2018   HDL 43 (L) 08/28/2018   LDLCALC 106 (H) 08/28/2018   TRIG 190 (H) 08/28/2018   CHOLHDL 4.2 08/28/2018   Decrease fat and increase exercise    Overweight (BMI 25.0-29.9)  Patient re-educated about  the importance of commitment to a  minimum of 150 minutes of exercise per week as able.  The importance of healthy food choices with portion control discussed, as well as eating regularly and within a 12 hour window most days. The need to choose "clean , green" food 50 to 75% of the time is discussed, as well as to make water the primary drink and set a goal of 64 ounces water daily.    Weight /BMI 09/03/2018 09/05/2017 05/23/2017  WEIGHT 201 lb 210 lb 199 lb  HEIGHT 5\' 9"  5\' 9"  5\' 9"   BMI 29.68 kg/m2 31.01 kg/m2 29.39 kg/m2

## 2018-09-08 ENCOUNTER — Encounter: Payer: Self-pay | Admitting: Family Medicine

## 2018-09-08 NOTE — Assessment & Plan Note (Signed)
  Patient re-educated about  the importance of commitment to a  minimum of 150 minutes of exercise per week as able.  The importance of healthy food choices with portion control discussed, as well as eating regularly and within a 12 hour window most days. The need to choose "clean , green" food 50 to 75% of the time is discussed, as well as to make water the primary drink and set a goal of 64 ounces water daily.    Weight /BMI 09/03/2018 09/05/2017 05/23/2017  WEIGHT 201 lb 210 lb 199 lb  HEIGHT 5\' 9"  5\' 9"  5\' 9"   BMI 29.68 kg/m2 31.01 kg/m2 29.39 kg/m2

## 2018-09-08 NOTE — Assessment & Plan Note (Signed)
Uncontrolled , start low dose sattin Hyperlipidemia:Low fat diet discussed and encouraged.   Lipid Panel  Lab Results  Component Value Date   CHOL 180 08/28/2018   HDL 43 (L) 08/28/2018   LDLCALC 106 (H) 08/28/2018   TRIG 190 (H) 08/28/2018   CHOLHDL 4.2 08/28/2018   Decrease fat and increase exercise

## 2018-09-08 NOTE — Assessment & Plan Note (Signed)
Uncontrolled, markedly, refer to diabetic ed, start once daily testing, send weekly report Change to janumet Commit to xercise Felicia Schneider is reminded of the importance of commitment to daily physical activity for 30 minutes or more, as able and the need to limit carbohydrate intake to 30 to 60 grams per meal to help with blood sugar control.   The need to take medication as prescribed, test blood sugar as directed, and to call between visits if there is a concern that blood sugar is uncontrolled is also discussed.   Felicia Schneider is reminded of the importance of daily foot exam, annual eye examination, and good blood sugar, blood pressure and cholesterol control.  Diabetic Labs Latest Ref Rng & Units 08/28/2018 05/24/2017 05/19/2017 05/18/2016 04/11/2016  HbA1c <5.7 % of total Hgb 9.6(H) - 7.0(H) - -  Microalbumin mg/dL 1.1 15.6(H) - - -  Micro/Creat Ratio 0.0 - 30.0 mg/g creat - 8.9 - - -  Chol <200 mg/dL 180 - 165 - -  HDL > OR = 50 mg/dL 43(L) - 42(L) - -  Calc LDL mg/dL (calc) 106(H) - 100(H) - -  Triglycerides <150 mg/dL 190(H) - 124 - -  Creatinine 0.50 - 1.05 mg/dL 1.09(H) - 0.94 1.04 1.24(H)   BP/Weight 09/03/2018 09/05/2017 05/23/2017 05/24/2016 04/11/2016 02/28/2016 10/28/5629  Systolic BP 497 026 378 588 502 774 128  Diastolic BP 90 84 82 82 78 88 80  Wt. (Lbs) 201 210 199 203.4 203 204.12 201  BMI 29.68 31.01 29.39 30.04 29.13 29.29 28.84   Foot/eye exam completion dates Latest Ref Rng & Units 09/03/2018 05/23/2017  Eye Exam No Retinopathy - -  Foot Form Completion - Done Done

## 2018-09-08 NOTE — Assessment & Plan Note (Addendum)
UnControlled,increase med dose to 1.5 tabs daily DASH diet and commitment to daily physical activity for a minimum of 30 minutes discussed and encouraged, as a part of hypertension management. The importance of attaining a healthy weight is also discussed.  BP/Weight 09/03/2018 09/05/2017 05/23/2017 05/24/2016 04/11/2016 02/28/2016 12/09/2444  Systolic BP 286 381 771 165 790 383 338  Diastolic BP 90 84 82 82 78 88 80  Wt. (Lbs) 201 210 199 203.4 203 204.12 201  BMI 29.68 31.01 29.39 30.04 29.13 29.29 28.84

## 2018-10-06 ENCOUNTER — Other Ambulatory Visit: Payer: Self-pay | Admitting: Family Medicine

## 2018-10-14 ENCOUNTER — Other Ambulatory Visit (HOSPITAL_COMMUNITY): Payer: Self-pay | Admitting: Family Medicine

## 2018-10-14 ENCOUNTER — Ambulatory Visit (HOSPITAL_COMMUNITY)
Admission: RE | Admit: 2018-10-14 | Discharge: 2018-10-14 | Disposition: A | Discharge status: Home / Self Care | Payer: 59 | Source: Ambulatory Visit | Attending: Family Medicine | Admitting: Family Medicine

## 2018-10-14 ENCOUNTER — Other Ambulatory Visit: Payer: Self-pay

## 2018-10-14 DIAGNOSIS — Z1231 Encounter for screening mammogram for malignant neoplasm of breast: Secondary | ICD-10-CM

## 2018-10-18 ENCOUNTER — Encounter (HOSPITAL_COMMUNITY): Payer: 59

## 2018-11-09 ENCOUNTER — Other Ambulatory Visit: Payer: Self-pay | Admitting: Family Medicine

## 2018-12-10 ENCOUNTER — Ambulatory Visit: Payer: 59 | Admitting: Family Medicine

## 2018-12-16 ENCOUNTER — Ambulatory Visit: Payer: 59 | Admitting: Family Medicine

## 2019-02-25 ENCOUNTER — Other Ambulatory Visit: Payer: Self-pay | Admitting: Family Medicine

## 2019-03-26 ENCOUNTER — Telehealth: Payer: Self-pay | Admitting: Family Medicine

## 2019-03-26 ENCOUNTER — Encounter: Payer: 59 | Admitting: Women's Health

## 2019-03-26 DIAGNOSIS — E1169 Type 2 diabetes mellitus with other specified complication: Secondary | ICD-10-CM

## 2019-03-26 DIAGNOSIS — I10 Essential (primary) hypertension: Secondary | ICD-10-CM

## 2019-03-26 DIAGNOSIS — E785 Hyperlipidemia, unspecified: Secondary | ICD-10-CM

## 2019-03-26 NOTE — Addendum Note (Signed)
Addended by: Abner Greenspan on: 03/26/2019 11:37 AM   Modules accepted: Orders

## 2019-03-26 NOTE — Telephone Encounter (Signed)
Please send fasting lipid, cmp and eGFr, hBA1C  To [facility] in [place] , I spoke with her she expects to get lab done this week.  Also need to make her an appt  in for 1 pm t on the 19 th , call her with appt infio, and verify lab also please , thanks!

## 2019-03-31 ENCOUNTER — Other Ambulatory Visit: Payer: Self-pay

## 2019-04-01 ENCOUNTER — Ambulatory Visit (INDEPENDENT_AMBULATORY_CARE_PROVIDER_SITE_OTHER): Payer: 59 | Admitting: Family Medicine

## 2019-04-01 ENCOUNTER — Encounter: Payer: Self-pay | Admitting: Women's Health

## 2019-04-01 ENCOUNTER — Encounter: Payer: Self-pay | Admitting: Family Medicine

## 2019-04-01 ENCOUNTER — Ambulatory Visit (INDEPENDENT_AMBULATORY_CARE_PROVIDER_SITE_OTHER): Payer: 59 | Admitting: Women's Health

## 2019-04-01 VITALS — BP 124/82 | Ht 69.0 in | Wt 200.0 lb

## 2019-04-01 VITALS — BP 118/82 | HR 84 | Temp 97.9°F | Resp 15 | Ht 70.0 in | Wt 201.4 lb

## 2019-04-01 DIAGNOSIS — N950 Postmenopausal bleeding: Secondary | ICD-10-CM

## 2019-04-01 DIAGNOSIS — I10 Essential (primary) hypertension: Secondary | ICD-10-CM | POA: Diagnosis not present

## 2019-04-01 DIAGNOSIS — E663 Overweight: Secondary | ICD-10-CM

## 2019-04-01 DIAGNOSIS — Z23 Encounter for immunization: Secondary | ICD-10-CM | POA: Diagnosis not present

## 2019-04-01 DIAGNOSIS — E785 Hyperlipidemia, unspecified: Secondary | ICD-10-CM

## 2019-04-01 DIAGNOSIS — Z01419 Encounter for gynecological examination (general) (routine) without abnormal findings: Secondary | ICD-10-CM | POA: Diagnosis not present

## 2019-04-01 DIAGNOSIS — E1165 Type 2 diabetes mellitus with hyperglycemia: Secondary | ICD-10-CM | POA: Diagnosis not present

## 2019-04-01 DIAGNOSIS — H6122 Impacted cerumen, left ear: Secondary | ICD-10-CM

## 2019-04-01 LAB — CMP14+EGFR
ALT: 45 IU/L — ABNORMAL HIGH (ref 0–32)
AST: 21 IU/L (ref 0–40)
Albumin/Globulin Ratio: 1.6 (ref 1.2–2.2)
Albumin: 4.5 g/dL (ref 3.8–4.9)
Alkaline Phosphatase: 59 IU/L (ref 39–117)
BUN/Creatinine Ratio: 17 (ref 9–23)
BUN: 21 mg/dL (ref 6–24)
Bilirubin Total: 0.5 mg/dL (ref 0.0–1.2)
CO2: 25 mmol/L (ref 20–29)
Calcium: 10.3 mg/dL — ABNORMAL HIGH (ref 8.7–10.2)
Chloride: 101 mmol/L (ref 96–106)
Creatinine, Ser: 1.27 mg/dL — ABNORMAL HIGH (ref 0.57–1.00)
GFR calc Af Amer: 55 mL/min/{1.73_m2} — ABNORMAL LOW (ref >59)
GFR calc non Af Amer: 48 mL/min/{1.73_m2} — ABNORMAL LOW (ref >59)
Globulin, Total: 2.8 g/dL (ref 1.5–4.5)
Glucose: 218 mg/dL — ABNORMAL HIGH (ref 65–99)
Potassium: 3.9 mmol/L (ref 3.5–5.2)
Sodium: 141 mmol/L (ref 134–144)
Total Protein: 7.3 g/dL (ref 6.0–8.5)

## 2019-04-01 LAB — LIPID PANEL WITH LDL/HDL RATIO
Cholesterol, Total: 99 mg/dL — ABNORMAL LOW (ref 100–199)
HDL: 33 mg/dL — ABNORMAL LOW (ref >39)
LDL Chol Calc (NIH): 41 mg/dL (ref 0–99)
LDL/HDL Ratio: 1.2 ratio (ref 0.0–3.2)
Triglycerides: 141 mg/dL (ref 0–149)
VLDL Cholesterol Cal: 25 mg/dL (ref 5–40)

## 2019-04-01 LAB — HEMOGLOBIN A1C
Est. average glucose Bld gHb Est-mCnc: 223 mg/dL
Hgb A1c MFr Bld: 9.4 % — ABNORMAL HIGH (ref 4.8–5.6)

## 2019-04-01 MED ORDER — JANUMET 50-1000 MG PO TABS: 1.0000 | ORAL_TABLET | Freq: Two times a day (BID) | ORAL | 3 refills | Status: DC

## 2019-04-01 MED ORDER — ROSUVASTATIN CALCIUM 5 MG PO TABS: ORAL_TABLET | ORAL | 3 refills | Status: DC

## 2019-04-01 MED ORDER — TRIAMTERENE-HCTZ 37.5-25 MG PO TABS: ORAL_TABLET | ORAL | 1 refills | Status: DC

## 2019-04-01 MED ORDER — GLIPIZIDE ER 5 MG PO TB24: 5.0000 mg | ORAL_TABLET | Freq: Every day | ORAL | 3 refills | Status: DC

## 2019-04-01 MED ORDER — ROSUVASTATIN CALCIUM 5 MG PO TABS: 5.0000 mg | ORAL_TABLET | Freq: Every day | ORAL | 1 refills | Status: DC

## 2019-04-01 NOTE — Patient Instructions (Addendum)
F/U in office with MD in 3 months, and 2 weeks, call if you need me before  Flu vaccine today  Left ear flush due to wax today  Refer diabetic ed , pt will make contact, and give info on carb counting  It is important that you exercise regularly at least 30 minutes 5 times a week. If you develop chest pain, have severe difficulty breathing, or feel very tired, stop exercising immediately and seek medical attention    New additional medication for diabetes is glipizide , need to speak with diabetic educator, and terst blood sugar every morning and send in report every Thursday  Goal for fasting blood sugar ranges from 80 to 120 and 2 hours after any meal or at bedtime should be between 130 to 170.

## 2019-04-01 NOTE — Patient Instructions (Addendum)
Marland KitchenCOVID-19 Vaccine Information can be found at: [email or web address] For questions related to vaccine distribution or appointments, please email [email or web address] or call [phone].   Vit D 2000 iu daily  Health Maintenance for Postmenopausal Women Menopause is a normal process in which your ability to get pregnant comes to an end. This process happens slowly over many months or years, usually between the ages of 18 and 82. Menopause is complete when you have missed your menstrual periods for 12 months. It is important to talk with your health care provider about some of the most common conditions that affect women after menopause (postmenopausal women). These include heart disease, cancer, and bone loss (osteoporosis). Adopting a healthy lifestyle and getting preventive care can help to promote your health and wellness. The actions you take can also lower your chances of developing some of these common conditions. What should I know about menopause? During menopause, you may get a number of symptoms, such as:  Hot flashes. These can be moderate or severe.  Night sweats.  Decrease in sex drive.  Mood swings.  Headaches.  Tiredness.  Irritability.  Memory problems.  Insomnia. Choosing to treat or not to treat these symptoms is a decision that you make with your health care provider. Do I need hormone replacement therapy?  Hormone replacement therapy is effective in treating symptoms that are caused by menopause, such as hot flashes and night sweats.  Hormone replacement carries certain risks, especially as you become older. If you are thinking about using estrogen or estrogen with progestin, discuss the benefits and risks with your health care provider. What is my risk for heart disease and stroke? The risk of heart disease, heart attack, and stroke increases as you age. One of the causes may be a change in the body's  hormones during menopause. This can affect how your body uses dietary fats, triglycerides, and cholesterol. Heart attack and stroke are medical emergencies. There are many things that you can do to help prevent heart disease and stroke. Watch your blood pressure  High blood pressure causes heart disease and increases the risk of stroke. This is more likely to develop in people who have high blood pressure readings, are of African descent, or are overweight.  Have your blood pressure checked: ? Every 3-5 years if you are 32-72 years of age. ? Every year if you are 1 years old or older. Eat a healthy diet   Eat a diet that includes plenty of vegetables, fruits, low-fat dairy products, and lean protein.  Do not eat a lot of foods that are high in solid fats, added sugars, or sodium. Get regular exercise Get regular exercise. This is one of the most important things you can do for your health. Most adults should:  Try to exercise for at least 150 minutes each week. The exercise should increase your heart rate and make you sweat (moderate-intensity exercise).  Try to do strengthening exercises at least twice each week. Do these in addition to the moderate-intensity exercise.  Spend less time sitting. Even light physical activity can be beneficial. Other tips  Work with your health care provider to achieve or maintain a healthy weight.  Do not use any products that contain nicotine or tobacco, such as cigarettes, e-cigarettes, and chewing tobacco. If you need help quitting, ask your health care provider.  Know your numbers. Ask your health care provider to check your cholesterol and your blood sugar (glucose). Continue to have your blood tested as directed by your health  care provider. Do I need screening for cancer? Depending on your health history and family history, you may need to have cancer screening at different stages of your life. This may include screening for:  Breast  cancer.  Cervical cancer.  Lung cancer.  Colorectal cancer. What is my risk for osteoporosis? After menopause, you may be at increased risk for osteoporosis. Osteoporosis is a condition in which bone destruction happens more quickly than new bone creation. To help prevent osteoporosis or the bone fractures that can happen because of osteoporosis, you may take the following actions:  If you are 75-60 years old, get at least 1,000 mg of calcium and at least 600 mg of vitamin D per day.  If you are older than age 63 but younger than age 36, get at least 1,200 mg of calcium and at least 600 mg of vitamin D per day.  If you are older than age 16, get at least 1,200 mg of calcium and at least 800 mg of vitamin D per day. Smoking and drinking excessive alcohol increase the risk of osteoporosis. Eat foods that are rich in calcium and vitamin D, and do weight-bearing exercises several times each week as directed by your health care provider. How does menopause affect my mental health? Depression may occur at any age, but it is more common as you become older. Common symptoms of depression include:  Low or sad mood.  Changes in sleep patterns.  Changes in appetite or eating patterns.  Feeling an overall lack of motivation or enjoyment of activities that you previously enjoyed.  Frequent crying spells. Talk with your health care provider if you think that you are experiencing depression. General instructions See your health care provider for regular wellness exams and vaccines. This may include:  Scheduling regular health, dental, and eye exams.  Getting and maintaining your vaccines. These include: ? Influenza vaccine. Get this vaccine each year before the flu season begins. ? Pneumonia vaccine. ? Shingles vaccine. ? Tetanus, diphtheria, and pertussis (Tdap) booster vaccine. Your health care provider may also recommend other immunizations. Tell your health care provider if you have ever  been abused or do not feel safe at home. Summary  Menopause is a normal process in which your ability to get pregnant comes to an end.  This condition causes hot flashes, night sweats, decreased interest in sex, mood swings, headaches, or lack of sleep.  Treatment for this condition may include hormone replacement therapy.  Take actions to keep yourself healthy, including exercising regularly, eating a healthy diet, watching your weight, and checking your blood pressure and blood sugar levels.  Get screened for cancer and depression. Make sure that you are up to date with all your vaccines. This information is not intended to replace advice given to you by your health care provider. Make sure you discuss any questions you have with your health care provider. Document Revised: 02/20/2018 Document Reviewed: 02/20/2018 Elsevier Patient Education  2020 [employer].

## 2019-04-01 NOTE — Progress Notes (Signed)
Felicia Schneider 10/09/1965 [id number]    History:    Presents for annual exam.  Postmenopausal at least 18 months and had a 4-day light spotting last week.  Prior to the spotting had some PMS type symptoms, breast tenderness, llow abdominal cramping like she was going to have a cycle.  Not sexually active denies need for STD screen.  Normal Pap and mammogram history.  04/2014 benign colon polyp 5-year follow-up mother colon cancer survivor.  Medical problems include hypertension, diabetes, anxiety/depression hypercholesteremia primary care manages, last hemoglobin A1c 9.  Has scheduled follow-up with nutritionist.  Past medical history, past surgical history, family history and social history were all reviewed and documented in the EPIC chart.  Works for [employer].  Working from home, son 5, daughter 56 both doing well.  Mother hypertension and diabetes.  ROS:  A ROS was performed and pertinent positives and negatives are included.  Exam:  Vitals:   04/01/19 1549  BP: 124/82  Weight: 200 lb (90.7 kg)  Height: 5\' 9"  (1.753 m)   Body mass index is 29.53 kg/m.   General appearance:  Normal Thyroid:  Symmetrical, normal in size, without palpable masses or nodularity. Respiratory  Auscultation:  Clear without wheezing or rhonchi Cardiovascular  Auscultation:  Regular rate, without rubs, murmurs or gallops  Edema/varicosities:  Not grossly evident Abdominal  Soft,nontender, without masses, guarding or rebound.  Liver/spleen:  No organomegaly noted  Hernia:  None appreciated  Skin  Inspection:  Grossly normal   Breasts: Examined lying and sitting.     Right: Without masses, retractions, discharge or axillary adenopathy.     Left: Without masses, retractions, discharge or axillary adenopathy. Gentitourinary   Inguinal/mons:  Normal without inguinal adenopathy  External genitalia:  Normal  BUS/Urethra/Skene's glands:  Normal  Vagina:  Normal  Cervix:  Normal  Uterus:  normal in  size, shape and contour.  Midline and mobile  Adnexa/parametria:     Rt: Without masses or tenderness.   Lt: Without masses or tenderness.  Anus and perineum: Normal  Digital rectal exam: Normal sphincter tone without palpated masses or tenderness  Assessment/Plan:  54 y.o. DBF G2 P2 for annual exam with complaint of 4-day light spotting cycle last week after amenorrhea greater than 18 months.  Postmenopausal spotting Diabetes, hypertension, hypercholesteremia-primary care and endocrinologist manages labs and meds.    Plan: Schedule ultrasound with endometrial lining less than 51mm  will watch if greater proceed with sonohysterogram.  Agreeable with plan.  SBEs, continue annual 3D screening mammogram, calcium rich foods, vitamin D 2000 daily encouraged.  Aware of importance of increasing regular cardio type exercise, decreasing calorie/carbs, keep scheduled appointment with nutritionist.  Has a treadmill desk at home but has not used it since purchase, reviewed importance of daily use.  Self-care, leisure activities encouraged.  Pap normal 08/2017, new screening guidelines reviewed.    09/2017 [facility], 5:15 PM 04/01/2019

## 2019-04-06 ENCOUNTER — Encounter: Payer: Self-pay | Admitting: Family Medicine

## 2019-04-06 DIAGNOSIS — H6120 Impacted cerumen, unspecified ear: Secondary | ICD-10-CM | POA: Insufficient documentation

## 2019-04-06 NOTE — Assessment & Plan Note (Signed)
Uncontrolled, educated in office for 15 minutes by myself and by nursing staff, also advised of great need to link up with diabetic edducator and she is taking responsibility for this. Encouraged to call in  Numbers/ send in on a weekly basis Felicia Schneider is reminded of the importance of commitment to daily physical activity for 30 minutes or more, as able and the need to limit carbohydrate intake to 30 to 60 grams per meal to help with blood sugar control.   The need to take medication as prescribed, test blood sugar as directed, and to call between visits if there is a concern that blood sugar is uncontrolled is also discussed.   Felicia Schneider is reminded of the importance of daily foot exam, annual eye examination, and good blood sugar, blood pressure and cholesterol control.  Diabetic Labs Latest Ref Rng & Units 03/31/2019 08/28/2018 05/24/2017 05/19/2017 05/18/2016  HbA1c 4.8 - 5.6 % 9.4(H) 9.6(H) - 7.0(H) -  Microalbumin mg/dL - 1.1 15.6(H) - -  Micro/Creat Ratio 0.0 - 30.0 mg/g creat - - 8.9 - -  Chol 100 - 199 mg/dL 56(D) 149 - 702 -  HDL >39 mg/dL 63(Z) 85(Y) - 85(O) -  Calc LDL 0 - 99 mg/dL 41 277(A) - 128(N) -  Triglycerides 0 - 149 mg/dL 867 672(C) - 947 -  Creatinine 0.57 - 1.00 mg/dL 0.96(G) 8.36(O) - 2.94 1.04   BP/Weight 04/01/2019 04/01/2019 09/03/2018 09/05/2017 05/23/2017 05/24/2016 04/11/2016  Systolic BP 118 124 142 120 130 117 110  Diastolic BP 82 82 90 84 82 82 78  Wt. (Lbs) 201.4 200 201 210 199 203.4 203  BMI 28.9 29.53 29.68 31.01 29.39 30.04 29.13   Foot/eye exam completion dates Latest Ref Rng & Units 09/03/2018 05/23/2017  Eye Exam No Retinopathy - -  Foot Form Completion - Done Done

## 2019-04-06 NOTE — Assessment & Plan Note (Signed)
  Patient re-educated about  the importance of commitment to a  minimum of 150 minutes of exercise per week as able.  The importance of healthy food choices with portion control discussed, as well as eating regularly and within a 12 hour window most days. The need to choose "clean , green" food 50 to 75% of the time is discussed, as well as to make water the primary drink and set a goal of 64 ounces water daily.    Weight /BMI 04/01/2019 04/01/2019 09/03/2018  WEIGHT 201 lb 6.4 oz 200 lb 201 lb  HEIGHT 5\' 10"  5\' 9"  5\' 9"   BMI 28.9 kg/m2 29.53 kg/m2 29.68 kg/m2

## 2019-04-06 NOTE — Assessment & Plan Note (Signed)
Hyperlipidemia:Low fat diet discussed and encouraged.   Lipid Panel  Lab Results  Component Value Date   CHOL 99 (L) 03/31/2019   HDL 33 (L) 03/31/2019   LDLCALC 41 03/31/2019   TRIG 141 03/31/2019   CHOLHDL 4.2 08/28/2018   Controlled, no change in medication

## 2019-04-06 NOTE — Assessment & Plan Note (Signed)
Controlled, no change in medication  

## 2019-04-06 NOTE — Assessment & Plan Note (Signed)
Successful irrigation by nursing

## 2019-04-06 NOTE — Progress Notes (Signed)
Felicia Schneider     MRN: [id number]      DOB: 1965-04-19   HPI Felicia Schneider is here for follow up and re-evaluation of chronic medical conditions,most specifically uncontrolled diabetes, based on recent labs which are overue.  medication management and review of any available recent lab and radiology data.  Preventive health is updated, specifically  Cancer screening and Immunization.   Has gyne appt today for post menopausal bleeding also C/o left ear pressure ROS Denies recent fever or chills. Denies sinus pressure, nasal congestion, ear pain or sore throat. Denies chest congestion, productive cough or wheezing. Denies chest pains, palpitations and leg swelling Denies abdominal pain, nausea, vomiting,diarrhea or constipation.   Denies dysuria, frequency, hesitancy or incontinence. Denies joint pain, swelling and limitation in mobility. Denies headaches, seizures, numbness, or tingling. Denies depression, anxiety or insomnia. Denies skin break down or rash.   PE  BP 118/82   Pulse 84   Temp 97.9 F (36.6 C) (Temporal)   Resp 15   Ht 5\' 10"  (1.778 m)   Wt 201 lb 6.4 oz (91.4 kg)   LMP 07/29/2014 (Exact Date)   SpO2 98%   BMI 28.90 kg/m   Patient alert and oriented and in no cardiopulmonary distress.  HEENT: No facial asymmetry, EOMI,     Neck supple .cerumen impaction left ear  Chest: Clear to auscultation bilaterally.  CVS: S1, S2 no murmurs, no S3.Regular rate.  ABD: Soft non tender.   Ext: No edema  MS: Adequate ROM spine, shoulders, hips and knees.  Skin: Intact, no ulcerations or rash noted.  Psych: Good eye contact, normal affect. Memory intact not anxious or depressed appearing.  CNS: CN 2-12 intact, power,  normal throughout.no focal deficits noted.   Assessment & Plan  Type 2 diabetes mellitus with hyperglycemia (HCC) Uncontrolled, educated in office for 15 minutes by myself and by nursing staff, also advised of great need to link up with diabetic  edducator and she is taking responsibility for this. Encouraged to call in  Numbers/ send in on a weekly basis [name] is reminded of the importance of commitment to daily physical activity for 30 minutes or more, as able and the need to limit carbohydrate intake to 30 to 60 grams per meal to help with blood sugar control.   The need to take medication as prescribed, test blood sugar as directed, and to call between visits if there is a concern that blood sugar is uncontrolled is also discussed.   [name] is reminded of the importance of daily foot exam, annual eye examination, and good blood sugar, blood pressure and cholesterol control.  Diabetic Labs Latest Ref Rng & Units 03/31/2019 08/28/2018 05/24/2017 05/19/2017 05/18/2016  HbA1c 4.8 - 5.6 % 9.4(H) 9.6(H) - 7.0(H) -  Microalbumin mg/dL - 1.1 15.6(H) - -  Micro/Creat Ratio 0.0 - 30.0 mg/g creat - - 8.9 - -  Chol 100 - 199 mg/dL 07/18/2016) 17(B - 939 -  HDL >39 mg/dL 030) 09(Q) - 33(A) -  Calc LDL 0 - 99 mg/dL 41 07(M) - 226(J) -  Triglycerides 0 - 149 mg/dL 335(K 562) - 563(S -  Creatinine 0.57 - 1.00 mg/dL 937) 3.42(A) - 7.68(T 1.04   BP/Weight 04/01/2019 04/01/2019 09/03/2018 09/05/2017 05/23/2017 05/24/2016 04/11/2016  Systolic BP 118 124 142 120 130 117 110  Diastolic BP 82 82 90 84 82 82 78  Wt. (Lbs) 201.4 200 201 210 199 203.4 203  BMI 28.9 29.53 29.68 31.01 29.39 30.04  29.13   Foot/eye exam completion dates Latest Ref Rng & Units 09/03/2018 05/23/2017  Eye Exam No Retinopathy - -  Foot Form Completion - Done Done        Essential hypertension Controlled, no change in medication   Overweight (BMI 25.0-29.9)  Patient re-educated about  the importance of commitment to a  minimum of 150 minutes of exercise per week as able.  The importance of healthy food choices with portion control discussed, as well as eating regularly and within a 12 hour window most days. The need to choose "clean , green" food 50 to 75% of the time is  discussed, as well as to make water the primary drink and set a goal of 64 ounces water daily.    Weight /BMI 04/01/2019 04/01/2019 09/03/2018  WEIGHT 201 lb 6.4 oz 200 lb 201 lb  HEIGHT 5\' 10"  5\' 9"  5\' 9"   BMI 28.9 kg/m2 29.53 kg/m2 29.68 kg/m2      Dyslipidemia, goal LDL below 100 Hyperlipidemia:Low fat diet discussed and encouraged.   Lipid Panel  Lab Results  Component Value Date   CHOL 99 (L) 03/31/2019   HDL 33 (L) 03/31/2019   LDLCALC 41 03/31/2019   TRIG 141 03/31/2019   CHOLHDL 4.2 08/28/2018   Controlled, no change in medication     Cerumen impaction Successful irrigation by nursing

## 2019-04-11 ENCOUNTER — Encounter: Payer: Self-pay | Admitting: Family Medicine

## 2019-04-22 ENCOUNTER — Encounter: Payer: Self-pay | Admitting: Family Medicine

## 2019-06-23 ENCOUNTER — Other Ambulatory Visit: Payer: Self-pay

## 2019-06-23 ENCOUNTER — Telehealth: Payer: Self-pay | Admitting: Family Medicine

## 2019-06-23 DIAGNOSIS — E1165 Type 2 diabetes mellitus with hyperglycemia: Secondary | ICD-10-CM

## 2019-06-23 NOTE — Telephone Encounter (Signed)
Labs ordered and will have her do asap

## 2019-06-23 NOTE — Telephone Encounter (Signed)
No appt on schedule. Call her please, needs non fasting hBA1C, chem 7 and EGFr the week of 04/19, her blood sugar was uncontrolled in January. Will determine next oV after lab is done Lab order already placed. Thanks

## 2019-06-27 ENCOUNTER — Encounter: Payer: Self-pay | Admitting: Family Medicine

## 2019-06-27 ENCOUNTER — Telehealth: Payer: Self-pay | Admitting: Family Medicine

## 2019-06-27 LAB — BASIC METABOLIC PANEL WITH GFR
BUN/Creatinine Ratio: 20 (calc) (ref 6–22)
BUN: 23 mg/dL (ref 7–25)
CO2: 28 mmol/L (ref 20–32)
Calcium: 9.9 mg/dL (ref 8.6–10.4)
Chloride: 102 mmol/L (ref 98–110)
Creat: 1.13 mg/dL — ABNORMAL HIGH (ref 0.50–1.05)
GFR, Est African American: 63 mL/min/{1.73_m2} (ref >=60)
GFR, Est Non African American: 54 mL/min/{1.73_m2} — ABNORMAL LOW (ref >=60)
Glucose, Bld: 132 mg/dL — ABNORMAL HIGH (ref 65–99)
Potassium: 3.9 mmol/L (ref 3.5–5.3)
Sodium: 139 mmol/L (ref 135–146)

## 2019-06-27 LAB — HEMOGLOBIN A1C
Hgb A1c MFr Bld: 7.3 % of total Hgb — ABNORMAL HIGH (ref <5.7)
Mean Plasma Glucose: 163 (calc)
eAG (mmol/L): 9 (calc)

## 2019-06-27 NOTE — Telephone Encounter (Signed)
Please , needs end September appt with me, needs microalb, CBC, fasting CBC, lipid, cmp and EGFR, HBA1C , TSH 5 days before appt, thanks

## 2019-06-30 ENCOUNTER — Other Ambulatory Visit: Payer: Self-pay | Admitting: *Deleted

## 2019-06-30 DIAGNOSIS — E785 Hyperlipidemia, unspecified: Secondary | ICD-10-CM

## 2019-06-30 DIAGNOSIS — I10 Essential (primary) hypertension: Secondary | ICD-10-CM

## 2019-06-30 DIAGNOSIS — E1165 Type 2 diabetes mellitus with hyperglycemia: Secondary | ICD-10-CM

## 2019-06-30 NOTE — Telephone Encounter (Signed)
Pt labs ordered and appointment made with verbal understanding

## 2019-07-14 ENCOUNTER — Ambulatory Visit: Payer: 59 | Admitting: Nurse Practitioner

## 2019-07-14 ENCOUNTER — Other Ambulatory Visit: Payer: 59

## 2019-07-15 ENCOUNTER — Other Ambulatory Visit: Payer: Self-pay | Admitting: *Deleted

## 2019-07-15 DIAGNOSIS — N950 Postmenopausal bleeding: Secondary | ICD-10-CM

## 2019-07-16 ENCOUNTER — Other Ambulatory Visit: Payer: 59

## 2019-07-16 ENCOUNTER — Ambulatory Visit: Payer: 59 | Admitting: Obstetrics & Gynecology

## 2019-07-16 ENCOUNTER — Ambulatory Visit: Payer: 59 | Admitting: Women's Health

## 2019-07-16 ENCOUNTER — Ambulatory Visit: Payer: 59 | Admitting: Family Medicine

## 2019-07-31 ENCOUNTER — Ambulatory Visit: Payer: 59 | Admitting: Nurse Practitioner

## 2019-07-31 ENCOUNTER — Other Ambulatory Visit: Payer: 59

## 2019-08-26 ENCOUNTER — Other Ambulatory Visit: Payer: Self-pay | Admitting: Family Medicine

## 2019-10-13 ENCOUNTER — Other Ambulatory Visit (HOSPITAL_COMMUNITY): Payer: Self-pay | Admitting: Family Medicine

## 2019-10-13 ENCOUNTER — Other Ambulatory Visit: Payer: Self-pay | Admitting: *Deleted

## 2019-10-13 DIAGNOSIS — I10 Essential (primary) hypertension: Secondary | ICD-10-CM

## 2019-10-13 DIAGNOSIS — E1165 Type 2 diabetes mellitus with hyperglycemia: Secondary | ICD-10-CM

## 2019-10-13 DIAGNOSIS — E785 Hyperlipidemia, unspecified: Secondary | ICD-10-CM

## 2019-10-13 DIAGNOSIS — Z1231 Encounter for screening mammogram for malignant neoplasm of breast: Secondary | ICD-10-CM

## 2019-10-24 ENCOUNTER — Ambulatory Visit (HOSPITAL_COMMUNITY): Payer: 59

## 2019-10-31 ENCOUNTER — Other Ambulatory Visit: Payer: Self-pay

## 2019-10-31 ENCOUNTER — Ambulatory Visit (HOSPITAL_COMMUNITY)
Admission: RE | Admit: 2019-10-31 | Discharge: 2019-10-31 | Disposition: A | Discharge status: Home / Self Care | Payer: 59 | Source: Ambulatory Visit | Attending: Family Medicine | Admitting: Family Medicine

## 2019-10-31 DIAGNOSIS — Z1231 Encounter for screening mammogram for malignant neoplasm of breast: Secondary | ICD-10-CM | POA: Diagnosis present

## 2019-11-03 ENCOUNTER — Other Ambulatory Visit: Payer: Self-pay | Admitting: Family Medicine

## 2019-11-20 ENCOUNTER — Other Ambulatory Visit: Payer: Self-pay

## 2019-11-20 DIAGNOSIS — E785 Hyperlipidemia, unspecified: Secondary | ICD-10-CM

## 2019-11-20 DIAGNOSIS — I10 Essential (primary) hypertension: Secondary | ICD-10-CM

## 2019-11-20 DIAGNOSIS — E1165 Type 2 diabetes mellitus with hyperglycemia: Secondary | ICD-10-CM

## 2019-11-23 LAB — CMP14+EGFR
ALT: 43 IU/L — ABNORMAL HIGH (ref 0–32)
AST: 18 IU/L (ref 0–40)
Albumin/Globulin Ratio: 1.6 (ref 1.2–2.2)
Albumin: 4.5 g/dL (ref 3.8–4.9)
Alkaline Phosphatase: 53 IU/L (ref 48–121)
BUN/Creatinine Ratio: 19 (ref 9–23)
BUN: 21 mg/dL (ref 6–24)
Bilirubin Total: 0.4 mg/dL (ref 0.0–1.2)
CO2: 26 mmol/L (ref 20–29)
Calcium: 9.9 mg/dL (ref 8.7–10.2)
Chloride: 102 mmol/L (ref 96–106)
Creatinine, Ser: 1.13 mg/dL — ABNORMAL HIGH (ref 0.57–1.00)
GFR calc Af Amer: 63 mL/min/{1.73_m2} (ref >59)
GFR calc non Af Amer: 54 mL/min/{1.73_m2} — ABNORMAL LOW (ref >59)
Globulin, Total: 2.9 g/dL (ref 1.5–4.5)
Glucose: 102 mg/dL — ABNORMAL HIGH (ref 65–99)
Potassium: 4.1 mmol/L (ref 3.5–5.2)
Sodium: 142 mmol/L (ref 134–144)
Total Protein: 7.4 g/dL (ref 6.0–8.5)

## 2019-11-23 LAB — LIPID PANEL
Chol/HDL Ratio: 3.4 ratio (ref 0.0–4.4)
Cholesterol, Total: 145 mg/dL (ref 100–199)
HDL: 43 mg/dL (ref >39)
LDL Chol Calc (NIH): 74 mg/dL (ref 0–99)
Triglycerides: 162 mg/dL — ABNORMAL HIGH (ref 0–149)
VLDL Cholesterol Cal: 28 mg/dL (ref 5–40)

## 2019-11-23 LAB — MICROALBUMIN, URINE: Microalbumin, Urine: 15.7 ug/mL

## 2019-11-23 LAB — HEMOGLOBIN A1C
Est. average glucose Bld gHb Est-mCnc: 146 mg/dL
Hgb A1c MFr Bld: 6.7 % — ABNORMAL HIGH (ref 4.8–5.6)

## 2019-12-01 ENCOUNTER — Ambulatory Visit: Payer: 59 | Admitting: Family Medicine

## 2020-01-17 ENCOUNTER — Other Ambulatory Visit: Payer: Self-pay | Admitting: Family Medicine

## 2020-02-06 ENCOUNTER — Other Ambulatory Visit: Payer: Self-pay | Admitting: Family Medicine

## 2020-03-20 ENCOUNTER — Other Ambulatory Visit: Payer: Self-pay | Admitting: Family Medicine

## 2020-03-28 ENCOUNTER — Other Ambulatory Visit: Payer: Self-pay | Admitting: Family Medicine

## 2020-05-02 ENCOUNTER — Other Ambulatory Visit: Payer: Self-pay | Admitting: Family Medicine

## 2020-07-24 ENCOUNTER — Other Ambulatory Visit: Payer: Self-pay | Admitting: Family Medicine

## 2020-08-03 ENCOUNTER — Other Ambulatory Visit: Payer: Self-pay | Admitting: Family Medicine

## 2020-09-08 ENCOUNTER — Other Ambulatory Visit: Payer: Self-pay | Admitting: Family Medicine

## 2020-10-11 ENCOUNTER — Other Ambulatory Visit (HOSPITAL_COMMUNITY): Payer: Self-pay | Admitting: Family Medicine

## 2020-10-11 DIAGNOSIS — Z1231 Encounter for screening mammogram for malignant neoplasm of breast: Secondary | ICD-10-CM

## 2020-10-13 ENCOUNTER — Other Ambulatory Visit: Payer: Self-pay

## 2020-10-13 DIAGNOSIS — I10 Essential (primary) hypertension: Secondary | ICD-10-CM

## 2020-10-13 DIAGNOSIS — E1165 Type 2 diabetes mellitus with hyperglycemia: Secondary | ICD-10-CM

## 2020-10-13 DIAGNOSIS — E559 Vitamin D deficiency, unspecified: Secondary | ICD-10-CM

## 2020-10-13 DIAGNOSIS — E785 Hyperlipidemia, unspecified: Secondary | ICD-10-CM

## 2020-10-14 ENCOUNTER — Encounter: Payer: Self-pay | Admitting: Family Medicine

## 2020-10-15 LAB — CMP14+EGFR
ALT: 27 IU/L (ref 0–32)
AST: 17 IU/L (ref 0–40)
Albumin/Globulin Ratio: 1.5 (ref 1.2–2.2)
Albumin: 4.5 g/dL (ref 3.8–4.9)
Alkaline Phosphatase: 62 IU/L (ref 44–121)
BUN/Creatinine Ratio: 20 (ref 9–23)
BUN: 25 mg/dL — ABNORMAL HIGH (ref 6–24)
Bilirubin Total: 0.4 mg/dL (ref 0.0–1.2)
CO2: 21 mmol/L (ref 20–29)
Calcium: 10.3 mg/dL — ABNORMAL HIGH (ref 8.7–10.2)
Chloride: 104 mmol/L (ref 96–106)
Creatinine, Ser: 1.24 mg/dL — ABNORMAL HIGH (ref 0.57–1.00)
Globulin, Total: 3.1 g/dL (ref 1.5–4.5)
Glucose: 110 mg/dL — ABNORMAL HIGH (ref 65–99)
Potassium: 4.3 mmol/L (ref 3.5–5.2)
Sodium: 147 mmol/L — ABNORMAL HIGH (ref 134–144)
Total Protein: 7.6 g/dL (ref 6.0–8.5)
eGFR: 51 mL/min/{1.73_m2} — ABNORMAL LOW (ref >59)

## 2020-10-15 LAB — CBC
Hematocrit: 38 % (ref 34.0–46.6)
Hemoglobin: 12.7 g/dL (ref 11.1–15.9)
MCH: 28.6 pg (ref 26.6–33.0)
MCHC: 33.4 g/dL (ref 31.5–35.7)
MCV: 86 fL (ref 79–97)
Platelets: 386 10*3/uL (ref 150–450)
RBC: 4.44 x10E6/uL (ref 3.77–5.28)
RDW: 13.4 % (ref 11.7–15.4)
WBC: 7.3 10*3/uL (ref 3.4–10.8)

## 2020-10-15 LAB — LIPID PANEL WITH LDL/HDL RATIO
Cholesterol, Total: 129 mg/dL (ref 100–199)
HDL: 41 mg/dL (ref >39)
LDL Chol Calc (NIH): 57 mg/dL (ref 0–99)
LDL/HDL Ratio: 1.4 ratio (ref 0.0–3.2)
Triglycerides: 187 mg/dL — ABNORMAL HIGH (ref 0–149)
VLDL Cholesterol Cal: 31 mg/dL (ref 5–40)

## 2020-10-15 LAB — HEMOGLOBIN A1C
Est. average glucose Bld gHb Est-mCnc: 183 mg/dL
Hgb A1c MFr Bld: 8 % — ABNORMAL HIGH (ref 4.8–5.6)

## 2020-10-15 LAB — TSH: TSH: 1.34 u[IU]/mL (ref 0.450–4.500)

## 2020-10-15 LAB — VITAMIN D 25 HYDROXY (VIT D DEFICIENCY, FRACTURES): Vit D, 25-Hydroxy: 69.9 ng/mL (ref 30.0–100.0)

## 2020-10-15 LAB — MICROALBUMIN / CREATININE URINE RATIO
Creatinine, Urine: 117 mg/dL
Microalb/Creat Ratio: 34 mg/g creat — ABNORMAL HIGH (ref 0–29)
Microalbumin, Urine: 39.7 ug/mL

## 2020-10-31 ENCOUNTER — Other Ambulatory Visit: Payer: Self-pay | Admitting: Family Medicine

## 2020-11-03 ENCOUNTER — Ambulatory Visit (HOSPITAL_COMMUNITY)
Admission: RE | Admit: 2020-11-03 | Discharge: 2020-11-03 | Disposition: A | Discharge status: Home / Self Care | Payer: 59 | Source: Ambulatory Visit | Attending: Family Medicine | Admitting: Family Medicine

## 2020-11-03 ENCOUNTER — Ambulatory Visit (INDEPENDENT_AMBULATORY_CARE_PROVIDER_SITE_OTHER): Payer: 59 | Admitting: Family Medicine

## 2020-11-03 ENCOUNTER — Other Ambulatory Visit: Payer: Self-pay

## 2020-11-03 ENCOUNTER — Encounter: Payer: Self-pay | Admitting: Family Medicine

## 2020-11-03 ENCOUNTER — Ambulatory Visit: Payer: 59 | Admitting: Family Medicine

## 2020-11-03 VITALS — BP 134/79 | HR 89 | Resp 16 | Ht 69.0 in | Wt 199.0 lb

## 2020-11-03 DIAGNOSIS — Z8249 Family history of ischemic heart disease and other diseases of the circulatory system: Secondary | ICD-10-CM

## 2020-11-03 DIAGNOSIS — Z1231 Encounter for screening mammogram for malignant neoplasm of breast: Secondary | ICD-10-CM | POA: Insufficient documentation

## 2020-11-03 DIAGNOSIS — E663 Overweight: Secondary | ICD-10-CM | POA: Diagnosis not present

## 2020-11-03 DIAGNOSIS — E785 Hyperlipidemia, unspecified: Secondary | ICD-10-CM

## 2020-11-03 DIAGNOSIS — I10 Essential (primary) hypertension: Secondary | ICD-10-CM | POA: Diagnosis not present

## 2020-11-03 DIAGNOSIS — G8929 Other chronic pain: Secondary | ICD-10-CM

## 2020-11-03 DIAGNOSIS — E1165 Type 2 diabetes mellitus with hyperglycemia: Secondary | ICD-10-CM | POA: Diagnosis not present

## 2020-11-03 DIAGNOSIS — M79672 Pain in left foot: Secondary | ICD-10-CM

## 2020-11-03 MED ORDER — EZETIMIBE 10 MG PO TABS: ORAL_TABLET | ORAL | 3 refills | Status: DC

## 2020-11-03 NOTE — Patient Instructions (Addendum)
F/U  mid  Nov , call if you need me before  Fasting lipid, cmp and EGFr,HBA1C Nov 3 or after  New is zetia one tablet every Monday, Wednesday and Friday, continue other meds  Goal for fasting blood sugar ranges from 80 to 120 and 2 hours after any meal or at bedtime should be between 130 to 170.  MESSAGE if blood sugar remains high , as I will increase medication dose  You will be referred to Cardiology and Podiatry in [place]  Please schedule Gyne exAM FOR PAP WHICH IS PAST DUE  It is important that you exercise regularly at least 30 minutes 5 times a week. If you develop chest pain, have severe difficulty breathing, or feel very tired, stop exercising immediately and seek medical attention   Thanks for choosing [facility], we consider it a privelige to serve you.

## 2020-11-04 ENCOUNTER — Ambulatory Visit (HOSPITAL_COMMUNITY): Payer: 59

## 2020-11-04 ENCOUNTER — Ambulatory Visit: Payer: 59 | Admitting: Family Medicine

## 2020-11-05 ENCOUNTER — Ambulatory Visit (HOSPITAL_COMMUNITY): Payer: 59

## 2020-11-07 ENCOUNTER — Encounter: Payer: Self-pay | Admitting: Family Medicine

## 2020-11-07 DIAGNOSIS — G8929 Other chronic pain: Secondary | ICD-10-CM | POA: Insufficient documentation

## 2020-11-07 NOTE — Progress Notes (Signed)
Felicia Schneider     MRN: [id number]      DOB: Jul 03, 1965   HPI Felicia Schneider is here for follow up and re-evaluation of chronic medical conditions, medication management and review of any available recent lab and radiology data.  Preventive health is updated, specifically  Cancer screening and Immunization.   Questions or concerns regarding consultations or procedures which the PT has had in the interim are  addressed. The PT denies any adverse reactions to current medications since the last visit.  Significant personal stress , due to family illness and loss, in the past 1 year, has not been testing blood sugar, has poor eating habits  and has not been exercising Denies polyuria, polydipsia, blurred vision , or hypoglycemic episodes.   ROS Denies recent fever or chills. Denies sinus pressure, nasal congestion, ear pain or sore throat. Denies chest congestion, productive cough or wheezing. Denies chest pains, palpitations and leg swelling Denies abdominal pain, nausea, vomiting,diarrhea or constipation.   Denies dysuria, frequency, hesitancy or incontinence. Approx 6 month h/o intermittent left foot pain and swelling no aggravating factor identified, at times unable to walk Denies headaches, seizures, numbness, or tingling. Denies depression, anxiety or insomnia. Denies skin break down or rash.   PE  BP 134/79   Pulse 89   Resp 16   Ht 5\' 9"  (1.753 m)   Wt 199 lb (90.3 kg)   LMP 07/29/2014 (Exact Date)   SpO2 95%   BMI 29.39 kg/m   Patient alert and oriented and in no cardiopulmonary distress.  HEENT: No facial asymmetry, EOMI,     Neck supple .  Chest: Clear to auscultation bilaterally.  CVS: S1, S2 no murmurs, no S3.Regular rate.  ABD: Soft non tender.   Ext: No edema  MS: Adequate ROM spine, shoulders, hips and knees.Mild  swelling of dorsum of left foot not tender, red or warm  Skin: Intact, no ulcerations or rash noted.  Psych: Good eye contact, normal affect.  Memory intact not anxious or depressed appearing.  CNS: CN 2-12 intact, power,  normal throughout.no focal deficits noted.   Assessment & Plan  Essential hypertension Controlled, no change in medication DASH diet and commitment to daily physical activity for a minimum of 30 minutes discussed and encouraged, as a part of hypertension management. The importance of attaining a healthy weight is also discussed.  BP/Weight 11/03/2020 04/01/2019 04/01/2019 09/03/2018 09/05/2017 05/23/2017 05/24/2016  Systolic BP 134 118 124 142 120 130 117  Diastolic BP 79 82 82 90 84 82 82  Wt. (Lbs) 199 201.4 200 201 210 199 203.4  BMI 29.39 28.9 29.53 29.68 31.01 29.39 30.04       Dyslipidemia, goal LDL below 100 Hyperlipidemia:Low fat diet discussed and encouraged.   Lipid Panel  Lab Results  Component Value Date   CHOL 129 10/13/2020   HDL 41 10/13/2020   LDLCALC 57 10/13/2020   TRIG 187 (H) 10/13/2020   CHOLHDL 3.4 11/21/2019     TG elevated , add zetia  Overweight (BMI 25.0-29.9)  Patient re-educated about  the importance of commitment to a  minimum of 150 minutes of exercise per week as able.  The importance of healthy food choices with portion control discussed, as well as eating regularly and within a 12 hour window most days. The need to choose "clean , green" food 50 to 75% of the time is discussed, as well as to make water the primary drink and set a goal of 64 ounces water  daily.    Weight /BMI 11/03/2020 04/01/2019 04/01/2019  WEIGHT 199 lb 201 lb 6.4 oz 200 lb  HEIGHT 5\' 9"  5\' 10"  5\' 9"   BMI 29.39 kg/m2 28.9 kg/m2 29.53 kg/m2      Type 2 diabetes mellitus with hyperglycemia (HCC) Uncontrolled, despite my recommendation, wants to work on lifestyle with no med change will send message if blood sugar remains elevated Felicia Schneider is reminded of the importance of commitment to daily physical activity for 30 minutes or more, as able and the need to limit carbohydrate intake to 30 to  60 grams per meal to help with blood sugar control.   The need to take medication as prescribed, test blood sugar as directed, and to call between visits if there is a concern that blood sugar is uncontrolled is also discussed.   Felicia Schneider is reminded of the importance of daily foot exam, annual eye examination, and good blood sugar, blood pressure and cholesterol control.  Diabetic Labs Latest Ref Rng & Units 10/13/2020 11/21/2019 06/26/2019 03/31/2019 08/28/2018  HbA1c 4.8 - 5.6 % 8.0(H) 6.7(H) 7.3(H) 9.4(H) 9.6(H)  Microalbumin mg/dL - - - - 1.1  Micro/Creat Ratio 0 - 29 mg/g creat 34(H) - - - -  Chol 100 - 199 mg/dL 06/28/2019 04/02/2019 - 08/30/2018) 202  HDL >39 mg/dL 41 43 - 542) 70(W)  Calc LDL 0 - 99 mg/dL 57 74 - 41 237)  Triglycerides 0 - 149 mg/dL 62(G) 31(D) - 176(H 607(P)  Creatinine 0.57 - 1.00 mg/dL 710(G) 269) 485(I) 1.27(H) 1.09(H)   BP/Weight 11/03/2020 04/01/2019 04/01/2019 09/03/2018 09/05/2017 05/23/2017 05/24/2016  Systolic BP 134 118 124 142 120 130 117  Diastolic BP 79 82 82 90 84 82 82  Wt. (Lbs) 199 201.4 200 201 210 199 203.4  BMI 29.39 28.9 29.53 29.68 31.01 29.39 30.04   Foot/eye exam completion dates Latest Ref Rng & Units 11/03/2020 09/03/2018  Eye Exam No Retinopathy - -  Foot Form Completion - Done Done        Chronic foot pain, left Intermittent at times disabling pain and swelling of left foot x 6 months, no inciting trauma has had UC eval and eval in office today , will refer to Podiatry

## 2020-11-07 NOTE — Assessment & Plan Note (Signed)
Controlled, no change in medication DASH diet and commitment to daily physical activity for a minimum of 30 minutes discussed and encouraged, as a part of hypertension management. The importance of attaining a healthy weight is also discussed.  BP/Weight 11/03/2020 04/01/2019 04/01/2019 09/03/2018 09/05/2017 05/23/2017 05/24/2016  Systolic BP 134 118 124 142 120 130 117  Diastolic BP 79 82 82 90 84 82 82  Wt. (Lbs) 199 201.4 200 201 210 199 203.4  BMI 29.39 28.9 29.53 29.68 31.01 29.39 30.04

## 2020-11-07 NOTE — Assessment & Plan Note (Signed)
Hyperlipidemia:Low fat diet discussed and encouraged.   Lipid Panel  Lab Results  Component Value Date   CHOL 129 10/13/2020   HDL 41 10/13/2020   LDLCALC 57 10/13/2020   TRIG 187 (H) 10/13/2020   CHOLHDL 3.4 11/21/2019     TG elevated , add zetia

## 2020-11-07 NOTE — Assessment & Plan Note (Signed)
Uncontrolled, despite my recommendation, wants to work on lifestyle with no med change will send message if blood sugar remains elevated Felicia Schneider is reminded of the importance of commitment to daily physical activity for 30 minutes or more, as able and the need to limit carbohydrate intake to 30 to 60 grams per meal to help with blood sugar control.   The need to take medication as prescribed, test blood sugar as directed, and to call between visits if there is a concern that blood sugar is uncontrolled is also discussed.   Felicia Schneider is reminded of the importance of daily foot exam, annual eye examination, and good blood sugar, blood pressure and cholesterol control.  Diabetic Labs Latest Ref Rng & Units 10/13/2020 11/21/2019 06/26/2019 03/31/2019 08/28/2018  HbA1c 4.8 - 5.6 % 8.0(H) 6.7(H) 7.3(H) 9.4(H) 9.6(H)  Microalbumin mg/dL - - - - 1.1  Micro/Creat Ratio 0 - 29 mg/g creat 34(H) - - - -  Chol 100 - 199 mg/dL 242 353 - 61(W) 431  HDL >39 mg/dL 41 43 - 54(M) 08(Q)  Calc LDL 0 - 99 mg/dL 57 74 - 41 761(P)  Triglycerides 0 - 149 mg/dL 509(T) 267(T) - 245 809(X)  Creatinine 0.57 - 1.00 mg/dL 8.33(A) 2.50(N) 3.97(Q) 1.27(H) 1.09(H)   BP/Weight 11/03/2020 04/01/2019 04/01/2019 09/03/2018 09/05/2017 05/23/2017 05/24/2016  Systolic BP 134 118 124 142 120 130 117  Diastolic BP 79 82 82 90 84 82 82  Wt. (Lbs) 199 201.4 200 201 210 199 203.4  BMI 29.39 28.9 29.53 29.68 31.01 29.39 30.04   Foot/eye exam completion dates Latest Ref Rng & Units 11/03/2020 09/03/2018  Eye Exam No Retinopathy - -  Foot Form Completion - Done Done

## 2020-11-07 NOTE — Assessment & Plan Note (Signed)
  Patient re-educated about  the importance of commitment to a  minimum of 150 minutes of exercise per week as able.  The importance of healthy food choices with portion control discussed, as well as eating regularly and within a 12 hour window most days. The need to choose "clean , green" food 50 to 75% of the time is discussed, as well as to make water the primary drink and set a goal of 64 ounces water daily.    Weight /BMI 11/03/2020 04/01/2019 04/01/2019  WEIGHT 199 lb 201 lb 6.4 oz 200 lb  HEIGHT 5\' 9"  5\' 10"  5\' 9"   BMI 29.39 kg/m2 28.9 kg/m2 29.53 kg/m2

## 2020-11-07 NOTE — Assessment & Plan Note (Signed)
Intermittent at times disabling pain and swelling of left foot x 6 months, no inciting trauma has had UC eval and eval in office today , will refer to Podiatry

## 2020-11-30 ENCOUNTER — Other Ambulatory Visit: Payer: Self-pay | Admitting: Family Medicine

## 2020-12-08 ENCOUNTER — Other Ambulatory Visit: Payer: Self-pay | Admitting: Family Medicine

## 2021-01-21 ENCOUNTER — Ambulatory Visit: Payer: 59 | Admitting: Cardiovascular Disease

## 2021-01-26 ENCOUNTER — Ambulatory Visit (INDEPENDENT_AMBULATORY_CARE_PROVIDER_SITE_OTHER): Payer: 59 | Admitting: Family Medicine

## 2021-01-26 ENCOUNTER — Other Ambulatory Visit: Payer: Self-pay

## 2021-01-26 ENCOUNTER — Encounter: Payer: Self-pay | Admitting: Family Medicine

## 2021-01-26 VITALS — BP 134/84 | HR 70 | Resp 70 | Ht 70.0 in | Wt 197.1 lb

## 2021-01-26 DIAGNOSIS — I10 Essential (primary) hypertension: Secondary | ICD-10-CM | POA: Diagnosis not present

## 2021-01-26 DIAGNOSIS — E785 Hyperlipidemia, unspecified: Secondary | ICD-10-CM

## 2021-01-26 DIAGNOSIS — Z23 Encounter for immunization: Secondary | ICD-10-CM | POA: Diagnosis not present

## 2021-01-26 DIAGNOSIS — M79672 Pain in left foot: Secondary | ICD-10-CM | POA: Diagnosis not present

## 2021-01-26 DIAGNOSIS — E663 Overweight: Secondary | ICD-10-CM

## 2021-01-26 DIAGNOSIS — G8929 Other chronic pain: Secondary | ICD-10-CM

## 2021-01-26 DIAGNOSIS — E1165 Type 2 diabetes mellitus with hyperglycemia: Secondary | ICD-10-CM

## 2021-01-26 LAB — CMP14+EGFR
ALT: 29 IU/L (ref 0–32)
AST: 15 IU/L (ref 0–40)
Albumin/Globulin Ratio: 1.5 (ref 1.2–2.2)
Albumin: 4.6 g/dL (ref 3.8–4.9)
Alkaline Phosphatase: 61 IU/L (ref 44–121)
BUN/Creatinine Ratio: 16 (ref 9–23)
BUN: 20 mg/dL (ref 6–24)
Bilirubin Total: 0.7 mg/dL (ref 0.0–1.2)
CO2: 23 mmol/L (ref 20–29)
Calcium: 9.9 mg/dL (ref 8.7–10.2)
Chloride: 99 mmol/L (ref 96–106)
Creatinine, Ser: 1.25 mg/dL — ABNORMAL HIGH (ref 0.57–1.00)
Globulin, Total: 3 g/dL (ref 1.5–4.5)
Glucose: 128 mg/dL — ABNORMAL HIGH (ref 70–99)
Potassium: 4.1 mmol/L (ref 3.5–5.2)
Sodium: 140 mmol/L (ref 134–144)
Total Protein: 7.6 g/dL (ref 6.0–8.5)
eGFR: 50 mL/min/{1.73_m2} — ABNORMAL LOW (ref >59)

## 2021-01-26 LAB — HEMOGLOBIN A1C
Est. average glucose Bld gHb Est-mCnc: 163 mg/dL
Hgb A1c MFr Bld: 7.3 % — ABNORMAL HIGH (ref 4.8–5.6)

## 2021-01-26 LAB — LIPID PANEL
Chol/HDL Ratio: 2.7 ratio (ref 0.0–4.4)
Cholesterol, Total: 112 mg/dL (ref 100–199)
HDL: 42 mg/dL (ref >39)
LDL Chol Calc (NIH): 48 mg/dL (ref 0–99)
Triglycerides: 125 mg/dL (ref 0–149)
VLDL Cholesterol Cal: 22 mg/dL (ref 5–40)

## 2021-01-26 NOTE — Patient Instructions (Signed)
F/U end February, call if you need me sooner  Pneumonia 20 and flu vaccine today  Please get covid booster in next 2 weeks  Please get shingrix #2 in December    CONGRATS on markedly improved blood sugar, work on exercise commitment   Non fasing HBA1C, chem 7 and EGFR 3 days before next appt  Thanks for choosing [facility], we consider it a privelige to serve you.

## 2021-01-28 ENCOUNTER — Encounter: Payer: Self-pay | Admitting: Family Medicine

## 2021-01-28 NOTE — Assessment & Plan Note (Signed)
Improved, no med change Felicia Schneider is reminded of the importance of commitment to daily physical activity for 30 minutes or more, as able and the need to limit carbohydrate intake to 30 to 60 grams per meal to help with blood sugar control.   The need to take medication as prescribed, test blood sugar as directed, and to call between visits if there is a concern that blood sugar is uncontrolled is also discussed.   Felicia Schneider is reminded of the importance of daily foot exam, annual eye examination, and good blood sugar, blood pressure and cholesterol control.  Diabetic Labs Latest Ref Rng & Units 01/25/2021 10/13/2020 11/21/2019 06/26/2019 03/31/2019  HbA1c 4.8 - 5.6 % 7.3(H) 8.0(H) 6.7(H) 7.3(H) 9.4(H)  Microalbumin mg/dL - - - - -  Micro/Creat Ratio 0 - 29 mg/g creat - 34(H) - - -  Chol 100 - 199 mg/dL 622 633 354 - 56(Y)  HDL >39 mg/dL 42 41 43 - 56(L)  Calc LDL 0 - 99 mg/dL 48 57 74 - 41  Triglycerides 0 - 149 mg/dL 893 734(K) 876(O) - 115  Creatinine 0.57 - 1.00 mg/dL 7.26(O) 0.35(D) 9.74(B) 1.13(H) 1.27(H)   BP/Weight 01/26/2021 11/03/2020 04/01/2019 04/01/2019 09/03/2018 09/05/2017 05/23/2017  Systolic BP 134 134 118 124 142 120 130  Diastolic BP 84 79 82 82 90 84 82  Wt. (Lbs) 197.12 199 201.4 200 201 210 199  BMI 28.28 29.39 28.9 29.53 29.68 31.01 29.39   Foot/eye exam completion dates Latest Ref Rng & Units 11/03/2020 09/03/2018  Eye Exam No Retinopathy - -  Foot Form Completion - Done Done

## 2021-01-28 NOTE — Assessment & Plan Note (Signed)
  Patient re-educated about  the importance of commitment to a  minimum of 150 minutes of exercise per week as able.  The importance of healthy food choices with portion control discussed, as well as eating regularly and within a 12 hour window most days. The need to choose "clean , green" food 50 to 75% of the time is discussed, as well as to make water the primary drink and set a goal of 64 ounces water daily.    Weight /BMI 01/26/2021 11/03/2020 04/01/2019  WEIGHT 197 lb 1.9 oz 199 lb 201 lb 6.4 oz  HEIGHT 5\' 10"  5\' 9"  5\' 10"   BMI 28.28 kg/m2 29.39 kg/m2 28.9 kg/m2

## 2021-01-28 NOTE — Assessment & Plan Note (Signed)
DASH diet and commitment to daily physical activity for a minimum of 30 minutes discussed and encouraged, as a part of hypertension management. The importance of attaining a healthy weight is also discussed.  BP/Weight 01/26/2021 11/03/2020 04/01/2019 04/01/2019 09/03/2018 09/05/2017 05/23/2017  Systolic BP 134 134 118 124 142 120 130  Diastolic BP 84 79 82 82 90 84 82  Wt. (Lbs) 197.12 199 201.4 200 201 210 199  BMI 28.28 29.39 28.9 29.53 29.68 31.01 29.39     Controlled, no change in medication

## 2021-01-28 NOTE — Assessment & Plan Note (Signed)
Hyperlipidemia:Low fat diet discussed and encouraged.   Lipid Panel  Lab Results  Component Value Date   CHOL 112 01/25/2021   HDL 42 01/25/2021   LDLCALC 48 01/25/2021   TRIG 125 01/25/2021   CHOLHDL 2.7 01/25/2021  Controlled, no change in medication

## 2021-01-28 NOTE — Assessment & Plan Note (Signed)
Recent flare managed with injection by podiatry

## 2021-01-28 NOTE — Progress Notes (Signed)
Felicia Schneider     MRN: [id number]      DOB: March 04, 1966   HPI Felicia Schneider is here for follow up and re-evaluation of chronic medical conditions, medication management and review of any available recent lab and radiology data.  Preventive health is updated, specifically  Cancer screening and Immunization.   Questions or concerns regarding consultations or procedures which the PT has had in the interim are  addressed. The PT denies any adverse reactions to current medications since the last visit.  There are no new concerns.  There are no specific complaints  Denies polyuria, polydipsia, blurred vision , or hypoglycemic episodes.   ROS Denies recent fever or chills. Denies sinus pressure, nasal congestion, ear pain or sore throat. Denies chest congestion, productive cough or wheezing. Denies chest pains, palpitations and leg swelling Denies abdominal pain, nausea, vomiting,diarrhea or constipation.   Denies dysuria, frequency, hesitancy or incontinence. Denies joint pain, swelling and limitation in mobility. Denies headaches, seizures, numbness, or tingling. Denies depression, anxiety or insomnia. Denies skin break down or rash.   PE  BP 134/84   Pulse 70   Resp (!) 70   Ht 5\' 10"  (1.778 m)   Wt 197 lb 1.9 oz (89.4 kg)   LMP 07/29/2014 (Exact Date)   SpO2 95%   BMI 28.28 kg/m   Patient alert and oriented and in no cardiopulmonary distress.  HEENT: No facial asymmetry, EOMI,     Neck supple .  Chest: Clear to auscultation bilaterally.  CVS: S1, S2 no murmurs, no S3.Regular rate.  ABD: Soft non tender.   Ext: No edema  MS: Adequate ROM spine, shoulders, hips and knees.  Skin: Intact, no ulcerations or rash noted.  Psych: Good eye contact, normal affect. Memory intact not anxious or depressed appearing.  CNS: CN 2-12 intact, power,  normal throughout.no focal deficits noted.   Assessment & Plan  Type 2 diabetes mellitus with hyperglycemia (HCC) Improved, no  med change [name] is reminded of the importance of commitment to daily physical activity for 30 minutes or more, as able and the need to limit carbohydrate intake to 30 to 60 grams per meal to help with blood sugar control.   The need to take medication as prescribed, test blood sugar as directed, and to call between visits if there is a concern that blood sugar is uncontrolled is also discussed.   [name] is reminded of the importance of daily foot exam, annual eye examination, and good blood sugar, blood pressure and cholesterol control.  Diabetic Labs Latest Ref Rng & Units 01/25/2021 10/13/2020 11/21/2019 06/26/2019 03/31/2019  HbA1c 4.8 - 5.6 % 7.3(H) 8.0(H) 6.7(H) 7.3(H) 9.4(H)  Microalbumin mg/dL - - - - -  Micro/Creat Ratio 0 - 29 mg/g creat - 34(H) - - -  Chol 100 - 199 mg/dL 04/02/2019 782 956 - 213)  HDL >39 mg/dL 42 41 43 - 08(M)  Calc LDL 0 - 99 mg/dL 48 57 74 - 41  Triglycerides 0 - 149 mg/dL 57(Q 469) 629(B) - 284(X  Creatinine 0.57 - 1.00 mg/dL 324) 4.01(U) 2.72(Z) 1.13(H) 1.27(H)   BP/Weight 01/26/2021 11/03/2020 04/01/2019 04/01/2019 09/03/2018 09/05/2017 05/23/2017  Systolic BP 134 134 118 124 142 120 130  Diastolic BP 84 79 82 82 90 84 82  Wt. (Lbs) 197.12 199 201.4 200 201 210 199  BMI 28.28 29.39 28.9 29.53 29.68 31.01 29.39   Foot/eye exam completion dates Latest Ref Rng & Units 11/03/2020 09/03/2018  Eye Exam No  Retinopathy - -  Foot Form Completion - Done Done        Essential hypertension DASH diet and commitment to daily physical activity for a minimum of 30 minutes discussed and encouraged, as a part of hypertension management. The importance of attaining a healthy weight is also discussed.  BP/Weight 01/26/2021 11/03/2020 04/01/2019 04/01/2019 09/03/2018 09/05/2017 05/23/2017  Systolic BP 134 134 118 124 142 120 130  Diastolic BP 84 79 82 82 90 84 82  Wt. (Lbs) 197.12 199 201.4 200 201 210 199  BMI 28.28 29.39 28.9 29.53 29.68 31.01 29.39     Controlled, no  change in medication   Chronic foot pain, left Recent flare managed with injection by podiatry  Overweight (BMI 25.0-29.9)  Patient re-educated about  the importance of commitment to a  minimum of 150 minutes of exercise per week as able.  The importance of healthy food choices with portion control discussed, as well as eating regularly and within a 12 hour window most days. The need to choose "clean , green" food 50 to 75% of the time is discussed, as well as to make water the primary drink and set a goal of 64 ounces water daily.    Weight /BMI 01/26/2021 11/03/2020 04/01/2019  WEIGHT 197 lb 1.9 oz 199 lb 201 lb 6.4 oz  HEIGHT 5\' 10"  5\' 9"  5\' 10"   BMI 28.28 kg/m2 29.39 kg/m2 28.9 kg/m2      Dyslipidemia, goal LDL below 100 Hyperlipidemia:Low fat diet discussed and encouraged.   Lipid Panel  Lab Results  Component Value Date   CHOL 112 01/25/2021   HDL 42 01/25/2021   LDLCALC 48 01/25/2021   TRIG 125 01/25/2021   CHOLHDL 2.7 01/25/2021  Controlled, no change in medication

## 2021-01-30 ENCOUNTER — Other Ambulatory Visit: Payer: Self-pay | Admitting: Family Medicine

## 2021-02-22 ENCOUNTER — Other Ambulatory Visit (HOSPITAL_COMMUNITY)
Admission: RE | Admit: 2021-02-22 | Discharge: 2021-02-22 | Disposition: A | Discharge status: Home / Self Care | Payer: 59 | Source: Ambulatory Visit | Attending: Obstetrics & Gynecology | Admitting: Obstetrics & Gynecology

## 2021-02-22 ENCOUNTER — Ambulatory Visit (INDEPENDENT_AMBULATORY_CARE_PROVIDER_SITE_OTHER): Payer: 59 | Admitting: Obstetrics & Gynecology

## 2021-02-22 ENCOUNTER — Encounter: Payer: Self-pay | Admitting: Cardiovascular Disease

## 2021-02-22 ENCOUNTER — Encounter: Payer: Self-pay | Admitting: Obstetrics & Gynecology

## 2021-02-22 ENCOUNTER — Other Ambulatory Visit: Payer: Self-pay

## 2021-02-22 ENCOUNTER — Ambulatory Visit (INDEPENDENT_AMBULATORY_CARE_PROVIDER_SITE_OTHER): Payer: 59 | Admitting: Cardiovascular Disease

## 2021-02-22 VITALS — BP 124/80 | HR 81 | Resp 16 | Ht 68.25 in | Wt 197.0 lb

## 2021-02-22 DIAGNOSIS — Z01419 Encounter for gynecological examination (general) (routine) without abnormal findings: Secondary | ICD-10-CM

## 2021-02-22 DIAGNOSIS — E785 Hyperlipidemia, unspecified: Secondary | ICD-10-CM

## 2021-02-22 DIAGNOSIS — E663 Overweight: Secondary | ICD-10-CM | POA: Diagnosis not present

## 2021-02-22 DIAGNOSIS — Z8249 Family history of ischemic heart disease and other diseases of the circulatory system: Secondary | ICD-10-CM

## 2021-02-22 DIAGNOSIS — I10 Essential (primary) hypertension: Secondary | ICD-10-CM | POA: Diagnosis not present

## 2021-02-22 DIAGNOSIS — Z780 Asymptomatic menopausal state: Secondary | ICD-10-CM

## 2021-02-22 NOTE — Progress Notes (Signed)
Felicia Schneider 12-17-1965 [id number]   History:    55 y.o. G2P2L2 Single.  Son is 28 in [place].  Daughter is 30 in [place].  RP:  Established patient presenting for annual gyn exam   HPI: Postmenopause, well on no HRT.  No PMB.  No pelvic pain.  Abstinent x last Pap test Neg in 08/2017.  Paps Neg in the past.  Breasts normal.  Screening mammo Neg 10/2020.  [facility] 04/2014, 10 yr schedule. Urine/BMs normal.  BMI 29.73.  Health labw with Fam MD.  Past medical history,surgical history, family history and social history were all reviewed and documented in the EPIC chart.  Gynecologic History Patient's last menstrual period was 07/29/2014 (exact date).  Obstetric History OB History  Gravida Para Term Preterm AB Living  2 2       2   SAB IAB Ectopic Multiple Live Births               # Outcome Date GA Lbr Len/2nd Weight Sex Delivery Anes PTL Lv  2 Para           1 Para              ROS: A ROS was performed and pertinent positives and negatives are included in the history.  GENERAL: No fevers or chills. HEENT: No change in vision, no earache, sore throat or sinus congestion. NECK: No pain or stiffness. CARDIOVASCULAR: No chest pain or pressure. No palpitations. PULMONARY: No shortness of breath, cough or wheeze. GASTROINTESTINAL: No abdominal pain, nausea, vomiting or diarrhea, melena or bright red blood per rectum. GENITOURINARY: No urinary frequency, urgency, hesitancy or dysuria. MUSCULOSKELETAL: No joint or muscle pain, no back pain, no recent trauma. DERMATOLOGIC: No rash, no itching, no lesions. ENDOCRINE: No polyuria, polydipsia, no heat or cold intolerance. No recent change in weight. HEMATOLOGICAL: No anemia or easy bruising or bleeding. NEUROLOGIC: No headache, seizures, numbness, tingling or weakness. PSYCHIATRIC: No depression, no loss of interest in normal activity or change in sleep pattern.     Exam:   BP 124/80   Pulse 81   Resp 16   Ht 5' 8.25" (1.734 m)   Wt 197 lb  (89.4 kg)   LMP 07/29/2014 (Exact Date)   BMI 29.73 kg/m   Body mass index is 29.73 kg/m.  General appearance : Well developed well nourished female. No acute distress HEENT: Eyes: no retinal hemorrhage or exudates,  Neck supple, trachea midline, no carotid bruits, no thyroidmegaly Lungs: Clear to auscultation, no rhonchi or wheezes, or rib retractions  Heart: Regular rate and rhythm, no murmurs or gallops Breast:Examined in sitting and supine position were symmetrical in appearance, no palpable masses or tenderness,  no skin retraction, no nipple inversion, no nipple discharge, no skin discoloration, no axillary or supraclavicular lymphadenopathy Abdomen: no palpable masses or tenderness, no rebound or guarding Extremities: no edema or skin discoloration or tenderness  Pelvic: Vulva: Normal             Vagina: No gross lesions or discharge  Cervix: No gross lesions or discharge.  Pap reflex done.  Uterus  AV, normal size, shape and consistency, non-tender and mobile  Adnexa  Without masses or tenderness  Anus: Normal   Assessment/Plan:  55 y.o. female for annual exam   1. Encounter for routine gynecological examination with Papanicolaou smear of cervix Postmenopause, well on no HRT.  No PMB.  No pelvic pain.  Abstinent x last Pap test Neg in 08/2017.  Paps Neg in the past.  Breasts normal.  Screening mammo Neg 10/2020.  [facility] 04/2014, 10 yr schedule. Urine/BMs normal.  BMI 29.73.  Health labw with Fam MD. - Cytology - PAP( [facility])  2. Postmenopause Postmenopause, well on no HRT.  No PMB.  No pelvic pain.  Abstinent.  Vit D supplements, Ca++ 1.5 g/d total and regular weight bearing physical activities.  3. Overweight (BMI 25.0-29.9)  Low calorie/carb diet.  Regular fitness activities.  Genia Del MD, 8:18 AM 02/22/2021

## 2021-02-22 NOTE — Assessment & Plan Note (Signed)
History of essential hypertension a blood pressure measured today at 136/82.  She is on Maxide 37.5/25.

## 2021-02-22 NOTE — Assessment & Plan Note (Signed)
Mother, Edmonia Caprio, who is my patient has a history of CAD and her brother had a myocardial infarction 1 year ago.  Based on her risk factors I am going to get a coronary calcium score to risk stratify.

## 2021-02-22 NOTE — Assessment & Plan Note (Signed)
History of hyperlipidemia on statin therapy with lipid profile performed 01/25/2021 revealing total cholesterol 112, LDL 48 and HDL 42.

## 2021-02-22 NOTE — Progress Notes (Signed)
02/22/2021 [name]   1966/02/10  [id number]  Primary Physician Kerri Perches, MD Primary Cardiologist: Runell Gess MD Nicholes Calamity, [place]  HPI:  Felicia Schneider is a 55 y.o. mild to moderately overweight married African-American female mother of 2 children who is a Emergency planning/management officer for [employer]icia Schneider.  She was referred by Dr. Lodema Hong, her PCP, for evaluation because of risk factors.  Her mother, Felicia Schneider ,  is also a patient of mine.  Patient is currently working from home and is fairly sedentary.  Risk factors include treated hypertension, diabetes and hyperlipidemia as well as family history with a mother and brother both of whom had CAD.  She is never had a heart attack or stroke.  She denies chest pain or shortness of breath.   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   rosuvastatin (CRESTOR) 5 MG tablet TAKE ONE TABLET BY MOUTH AT BEDTIME EVERY MONDAY AND FRIDAY   triamterene-hydrochlorothiazide (MAXZIDE-25) 37.5-25 MG tablet TAKE 1 TABLET BY MOUTH EVERY DAY     Allergies  Allergen Reactions   Altace [Ramipril] Cough    cough    Social History   Socioeconomic History   Marital status: Single    Spouse name: Not on file   Number of children: Not on file   Years of education: Not on file   Highest education level: Not on file  Occupational History   Not on file  Tobacco Use   Smoking status: Never   Smokeless tobacco: Never  Vaping Use   Vaping Use: Never used  Substance and Sexual Activity   Alcohol use: No   Drug use: No   Sexual activity: Not Currently    Partners: Male    Birth  control/protection: Post-menopausal    Comment: intercourse age 41, less than 5 sexual partners,des neg  Other Topics Concern   Not on file  Social History Narrative   Not on file   Social Determinants of Health   Financial Resource Strain: Not on file  Food Insecurity: Not on file  Transportation Needs: Not on file  Physical Activity: Not on file  Stress: Not on file  Social Connections: Not on file  Intimate Partner Violence: Not on file     Review of Systems: General: negative for chills, fever, night sweats or weight changes.  Cardiovascular: negative for chest pain, dyspnea on exertion, edema, orthopnea, palpitations, paroxysmal nocturnal dyspnea or shortness of breath Dermatological: negative for rash Respiratory: negative for cough or wheezing Urologic: negative for hematuria Abdominal: negative for nausea, vomiting, diarrhea, bright red blood per rectum, melena, or hematemesis Neurologic: negative for visual changes, syncope, or dizziness All other systems reviewed and are otherwise negative except as noted above.    Blood pressure 136/82, pulse 66, height 5\' 10"  (1.778 m), weight 199 lb 3.2 oz (90.4 kg), last menstrual period 07/29/2014, SpO2 97 %.  General appearance: alert and no distress Neck: no adenopathy, no carotid bruit, no JVD, supple, symmetrical, trachea midline, and thyroid not enlarged, symmetric, no tenderness/mass/nodules Lungs: clear to auscultation  bilaterally Heart: regular rate and rhythm, S1, S2 normal, no murmur, click, rub or gallop Extremities: extremities normal, atraumatic, no cyanosis or edema Pulses: 2+ and symmetric Skin: Skin color, texture, turgor normal. No rashes or lesions Neurologic: Grossly normal  EKG sinus rhythm at 66 without ST or T wave changes.  There is borderline voltage criteria for LVH.  I personally reviewed this EKG.  ASSESSMENT AND PLAN:   Essential hypertension History of essential hypertension a blood pressure  measured today at 136/82.  She is on Maxide 37.5/25.  Dyslipidemia, goal LDL below 100 History of hyperlipidemia on statin therapy with lipid profile performed 01/25/2021 revealing total cholesterol 112, LDL 48 and HDL 42.  Family history of heart disease Mother, Felicia Schneider, who is my patient has a history of CAD and her brother had a myocardial infarction 1 year ago.  Based on her risk factors I am going to get a coronary calcium score to risk stratify.     Felicia Harp MD FACP,FACC,FAHA, [facility] 02/22/2021 9:53 AM

## 2021-02-22 NOTE — Patient Instructions (Signed)
Medication Instructions:  Your physician recommends that you continue on your current medications as directed. Please refer to the Current Medication list given to you today.  *If you need a refill on your cardiac medications before your next appointment, please call your pharmacy*  Testing/Procedures: Dr. Berry has ordered a CT coronary calcium score. This test is done at [address]. This is $99 out of pocket.   Coronary CalciumScan A coronary calcium scan is an imaging test used to look for deposits of calcium and other fatty materials (plaques) in the inner lining of the blood vessels of the heart (coronary arteries). These deposits of calcium and plaques can partly clog and narrow the coronary arteries without producing any symptoms or warning signs. This puts a person at risk for a heart attack. This test can detect these deposits before symptoms develop. Tell a health care provider about:  Any allergies you have.  All medicines you are taking, including vitamins, herbs, eye drops, creams, and over-the-counter medicines.  Any problems you or family members have had with anesthetic medicines.  Any blood disorders you have.  Any surgeries you have had.  Any medical conditions you have.  Whether you are pregnant or may be pregnant. What are the risks? Generally, this is a safe procedure. However, problems may occur, including:  Harm to a pregnant woman and her unborn baby. This test involves the use of radiation. Radiation exposure can be dangerous to a pregnant woman and her unborn baby. If you are pregnant, you generally should not have this procedure done.  Slight increase in the risk of cancer. This is because of the radiation involved in the test. What happens before the procedure? No preparation is needed for this procedure. What happens during the procedure?  You will undress and remove any jewelry around your neck or chest.  You will put on a  hospital gown.  Sticky electrodes will be placed on your chest. The electrodes will be connected to an electrocardiogram (ECG) machine to record a tracing of the electrical activity of your heart.  A CT scanner will take pictures of your heart. During this time, you will be asked to lie still and hold your breath for 2-3 seconds while a picture of your heart is being taken. The procedure may vary among health care providers and hospitals. What happens after the procedure?  You can get dressed.  You can return to your normal activities.  It is up to you to get the results of your test. Ask your health care provider, or the department that is doing the test, when your results will be ready. Summary  A coronary calcium scan is an imaging test used to look for deposits of calcium and other fatty materials (plaques) in the inner lining of the blood vessels of the heart (coronary arteries).  Generally, this is a safe procedure. Tell your health care provider if you are pregnant or may be pregnant.  No preparation is needed for this procedure.  A CT scanner will take pictures of your heart.  You can return to your normal activities after the scan is done. This information is not intended to replace advice given to you by your health care provider. Make sure you discuss any questions you have with your health care provider. Document Released: 08/26/2007 Document Revised: 01/17/2016 Document Reviewed: 01/17/2016 Elsevier Interactive Patient Education  2017 Elsevier Inc.     Follow-Up: At [facility], you and your health needs are   our priority.  As part of our continuing mission to provide you with exceptional heart care, we have created designated Provider Care Teams.  These Care Teams include your primary Cardiologist (physician) and Advanced Practice Providers (APPs -  Physician Assistants and Nurse Practitioners) who all work together to provide you with the care you need, when you need  it.  We recommend signing up for the patient portal called "MyChart".  Sign up information is provided on this After Visit Summary.  MyChart is used to connect with patients for Virtual Visits (Telemedicine).  Patients are able to view lab/test results, encounter notes, upcoming appointments, etc.  Non-urgent messages can be sent to your provider as well.   To learn more about what you can do with MyChart, go to [email or web address].    Your next appointment:   12 month(s)  The format for your next appointment:   In Person  Provider:   Jonathan Berry, MD 

## 2021-02-23 LAB — CYTOLOGY - PAP: Diagnosis: NEGATIVE

## 2021-03-17 ENCOUNTER — Other Ambulatory Visit: Payer: Self-pay | Admitting: Family Medicine

## 2021-04-04 ENCOUNTER — Other Ambulatory Visit: Payer: Self-pay | Admitting: Family Medicine

## 2021-04-04 ENCOUNTER — Encounter: Payer: Self-pay | Admitting: Family Medicine

## 2021-04-04 MED ORDER — NIRMATRELVIR/RITONAVIR (PAXLOVID) TABLET (RENAL DOSING): 2.0000 | ORAL_TABLET | Freq: Two times a day (BID) | ORAL | 0 refills | Status: AC

## 2021-04-04 NOTE — Progress Notes (Signed)
Paxlovid prescribed.

## 2021-04-08 ENCOUNTER — Inpatient Hospital Stay: Admission: RE | Admit: 2021-04-08 | Payer: Self-pay | Source: Ambulatory Visit

## 2021-05-05 ENCOUNTER — Other Ambulatory Visit: Payer: Self-pay | Admitting: Family Medicine

## 2021-05-06 ENCOUNTER — Ambulatory Visit: Payer: 59 | Admitting: Family Medicine

## 2021-05-08 ENCOUNTER — Other Ambulatory Visit: Payer: Self-pay | Admitting: Family Medicine

## 2021-05-09 ENCOUNTER — Other Ambulatory Visit: Payer: Self-pay | Admitting: Family Medicine

## 2021-05-13 ENCOUNTER — Encounter: Payer: Self-pay | Admitting: Family Medicine

## 2021-05-13 ENCOUNTER — Ambulatory Visit (INDEPENDENT_AMBULATORY_CARE_PROVIDER_SITE_OTHER): Payer: 59 | Admitting: Family Medicine

## 2021-05-13 ENCOUNTER — Telehealth: Payer: Self-pay

## 2021-05-13 ENCOUNTER — Other Ambulatory Visit: Payer: Self-pay

## 2021-05-13 VITALS — BP 134/82 | HR 92 | Ht 70.0 in | Wt 195.0 lb

## 2021-05-13 DIAGNOSIS — E785 Hyperlipidemia, unspecified: Secondary | ICD-10-CM | POA: Diagnosis not present

## 2021-05-13 DIAGNOSIS — E663 Overweight: Secondary | ICD-10-CM

## 2021-05-13 DIAGNOSIS — I10 Essential (primary) hypertension: Secondary | ICD-10-CM | POA: Diagnosis not present

## 2021-05-13 DIAGNOSIS — E559 Vitamin D deficiency, unspecified: Secondary | ICD-10-CM

## 2021-05-13 DIAGNOSIS — E1165 Type 2 diabetes mellitus with hyperglycemia: Secondary | ICD-10-CM

## 2021-05-13 LAB — BMP8+EGFR
BUN/Creatinine Ratio: 15 (ref 9–23)
BUN: 18 mg/dL (ref 6–24)
CO2: 28 mmol/L (ref 20–29)
Calcium: 10.6 mg/dL — ABNORMAL HIGH (ref 8.7–10.2)
Chloride: 101 mmol/L (ref 96–106)
Creatinine, Ser: 1.2 mg/dL — ABNORMAL HIGH (ref 0.57–1.00)
Glucose: 93 mg/dL (ref 70–99)
Potassium: 4.4 mmol/L (ref 3.5–5.2)
Sodium: 143 mmol/L (ref 134–144)
eGFR: 53 mL/min/{1.73_m2} — ABNORMAL LOW (ref >59)

## 2021-05-13 LAB — HEMOGLOBIN A1C
Est. average glucose Bld gHb Est-mCnc: 157 mg/dL
Hgb A1c MFr Bld: 7.1 % — ABNORMAL HIGH (ref 4.8–5.6)

## 2021-05-13 MED ORDER — PIOGLITAZONE HCL-METFORMIN ER 15-1000 MG PO TB24
ORAL_TABLET | ORAL | 5 refills | Status: DC
Start: 2021-05-13 — End: 2021-05-20

## 2021-05-13 NOTE — Telephone Encounter (Signed)
Pioglitazone HCl (Oral Tablet) G 1  ?Pioglitazone HCl-Glimepiride (Oral Tablet)  ?Pioglitazone HCl-Metformin HCl (Oral Tablet) ?Repaglinide (Oral Tablet ?These are the only meds that are lower than the janumet. All the others are a tier 3 or higher  ?

## 2021-05-13 NOTE — Telephone Encounter (Signed)
Lvm hat med alternative was sent in  ?

## 2021-05-13 NOTE — Progress Notes (Signed)
? ?Felicia Schneider     MRN: [id number]      DOB: [birth year] ? ? ?HPI ?Ms. Fawcett is here for follow up and re-evaluation of chronic medical conditions, medication management and review of any available recent lab and radiology data.  ?Preventive health is updated, specifically  Cancer screening and Immunization.   ?Questions or concerns regarding consultations or procedures which the PT has had in the interim are  addressed. ?The PT denies any adverse reactions to current medications since the last visit.  ?Has to change janumet due to coverage ?Denies polyuria, polydipsia, blurred vision , or hypoglycemic episodes. ? ?ROS ?Denies recent fever or chills. ?Denies sinus pressure, nasal congestion, ear pain or sore throat. ?Denies chest congestion, productive cough or wheezing. ?Denies chest pains, palpitations and leg swelling ?Denies abdominal pain, nausea, vomiting,diarrhea or constipation.   ?Denies dysuria, frequency, hesitancy or incontinence. ?Denies joint pain, swelling and limitation in mobility. ?Denies headaches, seizures, numbness, or tingling. ?Denies depression, anxiety or insomnia. ?Denies skin break down or rash. ? ? ?PE ? ?BP 134/82   Pulse 92   Ht 5\' 10"  (1.778 m)   Wt 195 lb 0.6 oz (88.5 kg)   LMP 07/29/2014 (Exact Date)   SpO2 96%   BMI 27.99 kg/m?  ? ?Patient alert and oriented and in no cardiopulmonary distress. ? ?HEENT: No facial asymmetry, EOMI,     Neck supple . ? ?Chest: Clear to auscultation bilaterally. ? ?CVS: S1, S2 no murmurs, no S3.Regular rate. ? ?ABD: Soft non tender.  ? ?Ext: No edema ? ?MS: Adequate ROM spine, shoulders, hips and knees. ? ?Skin: Intact, no ulcerations or rash noted. ? ?Psych: Good eye contact, normal affect. Memory intact not anxious or depressed appearing. ? ?CNS: CN 2-12 intact, power,  normal throughout.no focal deficits noted. ? ? ?Assessment & Plan ? ?Essential hypertension ?Controlled, no change in medication ?DASH diet and commitment to daily physical  activity for a minimum of 30 minutes discussed and encouraged, as a part of hypertension management. ?The importance of attaining a healthy weight is also discussed. ? ?BP/Weight 05/13/2021 02/22/2021 02/22/2021 01/26/2021 11/03/2020 04/01/2019 04/01/2019  ?Systolic BP Q000111Q [id number] Q000111Q 134 118 124  ?Diastolic BP 82 82 80 84 79 82 82  ?Wt. (Lbs) 195.04 199.2 197 197.12 199 201.4 200  ?BMI 27.99 28.58 29.73 28.28 29.39 28.9 29.53  ? ? ? ? ? ?Type 2 diabetes mellitus with hyperglycemia (HCC) ?Improved though still not a goal,med changed due to formulary coverage ?Ms. Walloch is reminded of the importance of commitment to daily physical activity for 30 minutes or more, as able and the need to limit carbohydrate intake to 30 to 60 grams per meal to help with blood sugar control.  ? ?The need to take medication as prescribed, test blood sugar as directed, and to call between visits if there is a concern that blood sugar is uncontrolled is also discussed.  ? ?Ms. Marchel is reminded of the importance of daily foot exam, annual eye examination, and good blood sugar, blood pressure and cholesterol control. ? ?Diabetic Labs Latest Ref Rng & Units 05/12/2021 01/25/2021 10/13/2020 11/21/2019 06/26/2019  ?HbA1c 4.8 - 5.6 % 7.1(H) 7.3(H) 8.0(H) 6.7(H) 7.3(H)  ?Microalbumin mg/dL - - - - -  ?Micro/Creat Ratio 0 - 29 mg/g creat - - 34(H) - -  ?Chol 100 - 199 mg/dL - 112 129 145 -  ?HDL >39 mg/dL - 42 41 43 -  ?Calc LDL 0 - 99 mg/dL - 48 57  74 -  ?Triglycerides 0 - 149 mg/dL - 125 187(H) 162(H) -  ?Creatinine 0.57 - 1.00 mg/dL 1.20(H) 1.25(H) 1.24(H) 1.13(H) 1.13(H)  ? ?BP/Weight 05/13/2021 02/22/2021 02/22/2021 01/26/2021 11/03/2020 04/01/2019 04/01/2019  ?Systolic BP Q000111Q [id number] Q000111Q 134 118 124  ?Diastolic BP 82 82 80 84 79 82 82  ?Wt. (Lbs) 195.04 199.2 197 197.12 199 201.4 200  ?BMI 27.99 28.58 29.73 28.28 29.39 28.9 29.53  ? ?Foot/eye exam completion dates Latest Ref Rng & Units 11/03/2020 09/03/2018  ?Eye Exam No Retinopathy - -  ?Foot Form  Completion - Done Done  ? ? ? ? ? ? ?Dyslipidemia, goal LDL below 100 ?Hyperlipidemia:Low fat diet discussed and encouraged. ? ? ?Lipid Panel  ?Lab Results  ?Component Value Date  ? CHOL 112 01/25/2021  ? HDL 42 01/25/2021  ? [place] 48 01/25/2021  ? TRIG 125 01/25/2021  ? CHOLHDL 2.7 01/25/2021  ? ? ? ?Updated lab needed at/ before next visit. ? ? ?Overweight (BMI 25.0-29.9) ? ?Patient re-educated about  the importance of commitment to a  minimum of 150 minutes of exercise per week as able. ? ?The importance of healthy food choices with portion control discussed, as well as eating regularly and within a 12 hour window most days. ?The need to choose "clean , green" food 50 to 75% of the time is discussed, as well as to make water the primary drink and set a goal of 64 ounces water daily. ? ?  ?Weight /BMI 05/13/2021 02/22/2021 02/22/2021  ?WEIGHT 195 lb 0.6 oz 199 lb 3.2 oz 197 lb  ?HEIGHT 5\' 10"  5\' 10"  5' 8.25"  ?BMI 27.99 kg/m2 28.58 kg/m2 29.73 kg/m2  ?check TSH ? ? ? ? ?

## 2021-05-13 NOTE — Patient Instructions (Addendum)
F/U in 13 weeks, cal if you need me  sooner ? ?Alternative to janumet  will be prescribed, Nurse please get list of preferred meds as soon as possible,  as pt is out of janumet ? ?It is important that you exercise regularly at least 30 minutes 5 times a week. If you develop chest pain, have severe difficulty breathing, or feel very tired, stop exercising immediately and seek medical attention \ ? ?Goal for fasting blood sugar ranges from 80 to 120 and 2 hours after any meal or at bedtime should be between 130 to 170. ? ? ? ? ?Thanks for choosing [facility], we consider it a privelige to serve you. ? ? ?HBA1C, fasting lipid, cmp and EGFr, CBCV, TSH and vit D 5 days before next appt ? ?Thanks for choosing [facility], we consider it a privelige to serve you.]\ ? ? ?

## 2021-05-16 ENCOUNTER — Encounter: Payer: Self-pay | Admitting: Family Medicine

## 2021-05-16 NOTE — Assessment & Plan Note (Signed)
Controlled, no change in medication ?DASH diet and commitment to daily physical activity for a minimum of 30 minutes discussed and encouraged, as a part of hypertension management. ?The importance of attaining a healthy weight is also discussed. ? ?BP/Weight 05/13/2021 02/22/2021 02/22/2021 01/26/2021 11/03/2020 04/01/2019 04/01/2019  ?Systolic BP 134 136 124 134 134 118 124  ?Diastolic BP 82 82 80 84 79 82 82  ?Wt. (Lbs) 195.04 199.2 197 197.12 199 201.4 200  ?BMI 27.99 28.58 29.73 28.28 29.39 28.9 29.53  ? ? ? ? ?

## 2021-05-16 NOTE — Assessment & Plan Note (Signed)
?  Patient re-educated about  the importance of commitment to a  minimum of 150 minutes of exercise per week as able. ? ?The importance of healthy food choices with portion control discussed, as well as eating regularly and within a 12 hour window most days. ?The need to choose "clean , green" food 50 to 75% of the time is discussed, as well as to make water the primary drink and set a goal of 64 ounces water daily. ? ?  ?Weight /BMI 05/13/2021 02/22/2021 02/22/2021  ?WEIGHT 195 lb 0.6 oz 199 lb 3.2 oz 197 lb  ?HEIGHT 5\' 10"  5\' 10"  5' 8.25"  ?BMI 27.99 kg/m2 28.58 kg/m2 29.73 kg/m2  ?check TSH ? ? ?

## 2021-05-16 NOTE — Assessment & Plan Note (Signed)
Improved though still not a goal,med changed due to formulary coverage ?Felicia Schneider is reminded of the importance of commitment to daily physical activity for 30 minutes or more, as able and the need to limit carbohydrate intake to 30 to 60 grams per meal to help with blood sugar control.  ? ?The need to take medication as prescribed, test blood sugar as directed, and to call between visits if there is a concern that blood sugar is uncontrolled is also discussed.  ? ?Felicia Schneider is reminded of the importance of daily foot exam, annual eye examination, and good blood sugar, blood pressure and cholesterol control. ? ?Diabetic Labs Latest Ref Rng & Units 05/12/2021 01/25/2021 10/13/2020 11/21/2019 06/26/2019  ?HbA1c 4.8 - 5.6 % 7.1(H) 7.3(H) 8.0(H) 6.7(H) 7.3(H)  ?Microalbumin mg/dL - - - - -  ?Micro/Creat Ratio 0 - 29 mg/g creat - - 34(H) - -  ?Chol 100 - 199 mg/dL - 962 229 798 -  ?HDL >39 mg/dL - 42 41 43 -  ?Calc LDL 0 - 99 mg/dL - 48 57 74 -  ?Triglycerides 0 - 149 mg/dL - 921 194(R) 740(C) -  ?Creatinine 0.57 - 1.00 mg/dL 1.44(Y) 1.85(U) 3.14(H) 1.13(H) 1.13(H)  ? ?BP/Weight 05/13/2021 02/22/2021 02/22/2021 01/26/2021 11/03/2020 04/01/2019 04/01/2019  ?Systolic BP 134 136 124 134 134 118 124  ?Diastolic BP 82 82 80 84 79 82 82  ?Wt. (Lbs) 195.04 199.2 197 197.12 199 201.4 200  ?BMI 27.99 28.58 29.73 28.28 29.39 28.9 29.53  ? ?Foot/eye exam completion dates Latest Ref Rng & Units 11/03/2020 09/03/2018  ?Eye Exam No Retinopathy - -  ?Foot Form Completion - Done Done  ? ? ? ? ? ?

## 2021-05-16 NOTE — Assessment & Plan Note (Signed)
Hyperlipidemia:Low fat diet discussed and encouraged. ? ? ?Lipid Panel  ?Lab Results  ?Component Value Date  ? CHOL 112 01/25/2021  ? HDL 42 01/25/2021  ? [place] 48 01/25/2021  ? TRIG 125 01/25/2021  ? CHOLHDL 2.7 01/25/2021  ? ? ? ?Updated lab needed at/ before next visit. ? ?

## 2021-05-20 ENCOUNTER — Inpatient Hospital Stay: Admission: RE | Admit: 2021-05-20 | Payer: Self-pay | Source: Ambulatory Visit

## 2021-05-20 ENCOUNTER — Other Ambulatory Visit: Payer: Self-pay | Admitting: Family Medicine

## 2021-05-20 MED ORDER — PIOGLITAZONE HCL-METFORMIN HCL 15-850 MG PO TABS
1.0000 | ORAL_TABLET | Freq: Two times a day (BID) | ORAL | 5 refills | Status: DC
Start: 2021-05-20 — End: 2021-08-12

## 2021-05-30 ENCOUNTER — Other Ambulatory Visit: Payer: Self-pay | Admitting: Family Medicine

## 2021-06-07 ENCOUNTER — Ambulatory Visit: Payer: 59

## 2021-08-10 ENCOUNTER — Encounter: Payer: Self-pay | Admitting: Family Medicine

## 2021-08-11 DEATH — deceased

## 2021-08-12 ENCOUNTER — Ambulatory Visit (INDEPENDENT_AMBULATORY_CARE_PROVIDER_SITE_OTHER): Payer: 59 | Admitting: Family Medicine

## 2021-08-12 ENCOUNTER — Ambulatory Visit (INDEPENDENT_AMBULATORY_CARE_PROVIDER_SITE_OTHER)
Admission: RE | Admit: 2021-08-12 | Discharge: 2021-08-12 | Disposition: A | Discharge status: Home / Self Care | Payer: Self-pay | Source: Ambulatory Visit | Attending: Cardiovascular Disease | Admitting: Cardiovascular Disease

## 2021-08-12 ENCOUNTER — Other Ambulatory Visit: Payer: Self-pay | Admitting: Family Medicine

## 2021-08-12 ENCOUNTER — Encounter: Payer: Self-pay | Admitting: Family Medicine

## 2021-08-12 VITALS — BP 138/85 | HR 84 | Ht 70.0 in | Wt 199.1 lb

## 2021-08-12 DIAGNOSIS — E785 Hyperlipidemia, unspecified: Secondary | ICD-10-CM | POA: Diagnosis not present

## 2021-08-12 DIAGNOSIS — I10 Essential (primary) hypertension: Secondary | ICD-10-CM | POA: Diagnosis not present

## 2021-08-12 DIAGNOSIS — R7989 Other specified abnormal findings of blood chemistry: Secondary | ICD-10-CM

## 2021-08-12 DIAGNOSIS — N179 Acute kidney failure, unspecified: Secondary | ICD-10-CM

## 2021-08-12 DIAGNOSIS — E1165 Type 2 diabetes mellitus with hyperglycemia: Secondary | ICD-10-CM | POA: Diagnosis not present

## 2021-08-12 DIAGNOSIS — Z8249 Family history of ischemic heart disease and other diseases of the circulatory system: Secondary | ICD-10-CM

## 2021-08-12 LAB — TSH: TSH: 1.43 u[IU]/mL (ref 0.450–4.500)

## 2021-08-12 MED ORDER — TIRZEPATIDE 2.5 MG/0.5ML ~~LOC~~ SOAJ: 2.5000 mg | SUBCUTANEOUS | 0 refills | Status: DC

## 2021-08-12 NOTE — Assessment & Plan Note (Addendum)
Felicia Schneider is reminded of the importance of commitment to daily physical activity for 30 minutes or more, as able and the need to limit carbohydrate intake to 30 to 60 grams per meal to help with blood sugar control.  DETERIORATED WITH DETERIORATION ION L KIDNEY FUNCTION, STOP ACTOPLUSMET. START MOUNJARO, CONTINUE GLIPIZIDE AS BEFORE, TEST REGUALRLY AND CARB COUNT, RECOMMEND NUTRITION CONSULT The need to take medication as prescribed, test blood sugar as directed, and to call between visits if there is a concern that blood sugar is uncontrolled is also discussed.   Felicia Schneider is reminded of the importance of daily foot exam, annual eye examination, and good blood sugar, blood pressure and cholesterol control.     Latest Ref Rng & Units 08/11/2021    9:19 AM 05/12/2021    4:18 PM 01/25/2021   11:32 AM 10/13/2020   11:11 AM 11/21/2019   11:34 AM  Diabetic Labs  HbA1c 4.8 - 5.6 % 7.6   7.1   7.3   8.0   6.7    Micro/Creat Ratio 0 - 29 mg/g creat    34     Chol  WILL FOLLOW  P  112   129   145    HDL  WILL FOLLOW  P  42   41   43    Calc LDL  WILL FOLLOW  P  48   57   74    Triglycerides  WILL FOLLOW  P  125   187   162    Creatinine  WILL FOLLOW  P 1.20   1.25   1.24   1.13      P Preliminary result      08/12/2021   11:07 AM 05/13/2021   11:39 AM 05/13/2021   11:02 AM 02/22/2021    9:24 AM 02/22/2021    9:22 AM 02/22/2021    8:01 AM 01/26/2021   10:25 AM  BP/Weight  Systolic BP 138 134 151 136 126 124 134  Diastolic BP 85 82 84 82 88 80 84  Wt. (Lbs) 199.12  195.04  199.2 197   BMI 28.57 kg/m2  27.99 kg/m2  28.58 kg/m2 29.73 kg/m2       11/03/2020   10:20 AM 09/03/2018    8:00 AM  Foot/eye exam completion dates  Foot Form Completion Done Done

## 2021-08-12 NOTE — Assessment & Plan Note (Signed)
BUN OF 30 WIT CR 1.29 AND EGFR 48 NEEDS TO ENSURE 64 OZ WATER DAILY OR MORE, STOP METFORMIN , REPT IN 10 DAYS

## 2021-08-12 NOTE — Patient Instructions (Signed)
F/U in 7 weeks, tele visit re eveal blood sugar  Stop actoplusmet  Continue glipizide 5 mg one daily  New is Mounjaro once weekly  Need to log fasting blood sugars and send weekly, goal is 80 to 130  Monitor carbs and commit  exercise  Recommend dietitian if not improved  Thanks for choosing [facility], we consider it a privelige to serve you.

## 2021-08-12 NOTE — Assessment & Plan Note (Signed)
Controlled, no change in medication Hyperlipidemia:Low fat diet discussed and encouraged.   Lipid Panel TOTAL CHOL 148, ldl IS 75 AND tg 149, NORMAL HEPATIC PANEL

## 2021-08-12 NOTE — Addendum Note (Signed)
Addended by: Eual Fines on: 08/12/2021 02:54 PM   Modules accepted: Orders

## 2021-08-12 NOTE — Assessment & Plan Note (Signed)
CORRECTED, NOW 78, REDUCE SUPPLEMENT TO 3 TIMES WEEKLY

## 2021-08-12 NOTE — Progress Notes (Signed)
Felicia Schneider     MRN: [id number]      DOB: 11-Dec-1965   HPI [name] is here for follow up and re-evaluation of chronic medical conditions, medication management and review of any available recent lab and radiology data.  Preventive health is updated, specifically  Cancer screening and Immunization.   Questions or concerns regarding consultations or procedures which the PT has had in the interim are  addressed. The PT denies any adverse reactions to current medications since the last visit.  rEPORTS FEELING STRESSED, HAS NOT BEEN TESTING BLOOD SUGAR REGULARLY AND HGAS GAINED WEIGHT. CONCERNED ABOUT KIDNEY FUNCTION AND METFORMIN   ROS Denies recent fever or chills. Denies sinus pressure, nasal congestion, ear pain or sore throat. Denies chest congestion, productive cough or wheezing. Denies chest pains, palpitations and leg swelling Denies abdominal pain, nausea, vomiting,diarrhea or constipation.   Denies dysuria, frequency, hesitancy or incontinence. Denies joint pain, swelling and limitation in mobility. Denies headaches, seizures, numbness, or tingling. . Denies skin break down or rash.   PE  BP 138/85   Pulse 84   Ht 5' 10" (1.778 m)   Wt 199 lb 1.9 oz (90.3 kg)   LMP 07/29/2014 (Exact Date)   SpO2 97%   BMI 28.57 kg/m   Patient alert and oriented and in no cardiopulmonary distress.  HEENT: No facial asymmetry, EOMI,     Neck supple .  Chest: Clear to auscultation bilaterally.  CVS: S1, S2 no murmurs, no S3.Regular rate.  ABD: Soft non tender.   Ext: No edema  MS: Adequate ROM spine, shoulders, hips and knees.  Skin: Intact, no ulcerations or rash noted.  Psych: Good eye contact, normal affect. Memory intact not anxious or depressed appearing.  CNS: CN 2-12 intact, power,  normal throughout.no focal deficits noted.   Assessment & Plan  Essential hypertension Controlled, no change in medication DASH diet and commitment to daily physical activity for  a minimum of 30 minutes discussed and encouraged, as a part of hypertension management. The importance of attaining a healthy weight is also discussed.     08/12/2021   11:07 AM 05/13/2021   11:39 AM 05/13/2021   11:02 AM 02/22/2021    9:24 AM 02/22/2021    9:22 AM 02/22/2021    8:01 AM 01/26/2021   10:25 AM  BP/Weight  Systolic BP 453 646 803 212 248 250 037  Diastolic BP 85 82 84 82 88 80 84  Wt. (Lbs) 199.12  195.04  199.2 197   BMI 28.57 kg/m2  27.99 kg/m2  28.58 kg/m2 29.73 kg/m2        Dyslipidemia, goal LDL below 100 Controlled, no change in medication Hyperlipidemia:Low fat diet discussed and encouraged.   Lipid Panel TOTAL CHOL 148, ldl IS 75 AND tg 149, NORMAL HEPATIC PANEL      Type 2 diabetes mellitus with hyperglycemia [facility]) [name] is reminded of the importance of commitment to daily physical activity for 30 minutes or more, as able and the need to limit carbohydrate intake to 30 to 60 grams per meal to help with blood sugar control.  DETERIORATED WITH DETERIORATION ION L KIDNEY FUNCTION, STOP ACTOPLUSMET. START MOUNJARO, CONTINUE GLIPIZIDE AS BEFORE, TEST REGUALRLY AND CARB COUNT, RECOMMEND NUTRITION CONSULT The need to take medication as prescribed, test blood sugar as directed, and to call between visits if there is a concern that blood sugar is uncontrolled is also discussed.   [name] is reminded of the importance of daily foot  exam, annual eye examination, and good blood sugar, blood pressure and cholesterol control.     Latest Ref Rng & Units 08/11/2021    9:19 AM 05/12/2021    4:18 PM 01/25/2021   11:32 AM 10/13/2020   11:11 AM 11/21/2019   11:34 AM  Diabetic Labs  HbA1c 4.8 - 5.6 % 7.6   7.1   7.3   8.0   6.7    Micro/Creat Ratio 0 - 29 mg/g creat    34     Chol  WILL FOLLOW  P  112   129   145    HDL  WILL FOLLOW  P  42   41   43    Calc LDL  WILL FOLLOW  P  48   57   74    Triglycerides  WILL FOLLOW  P  125   187   162    Creatinine  WILL  FOLLOW  P 1.20   1.25   1.24   1.13      P Preliminary result      08/12/2021   11:07 AM 05/13/2021   11:39 AM 05/13/2021   11:02 AM 02/22/2021    9:24 AM 02/22/2021    9:22 AM 02/22/2021    8:01 AM 01/26/2021   10:25 AM  BP/Weight  Systolic BP 209 470 962 836 629 476 546  Diastolic BP 85 82 84 82 88 80 84  Wt. (Lbs) 199.12  195.04  199.2 197   BMI 28.57 kg/m2  27.99 kg/m2  28.58 kg/m2 29.73 kg/m2       11/03/2020   10:20 AM 09/03/2018    8:00 AM  Foot/eye exam completion dates  Foot Form Completion Done Done        Low vitamin D level CORRECTED, NOW 78, REDUCE SUPPLEMENT TO 3 TIMES WEEKLY  AKI (acute kidney injury) (HCC) BUN OF 30 WIT CR 1.29 AND EGFR 48 NEEDS TO ENSURE 64 OZ WATER DAILY OR MORE, STOP METFORMIN , REPT IN 10 DAYS

## 2021-08-12 NOTE — Assessment & Plan Note (Signed)
Controlled, no change in medication DASH diet and commitment to daily physical activity for a minimum of 30 minutes discussed and encouraged, as a part of hypertension management. The importance of attaining a healthy weight is also discussed.     08/12/2021   11:07 AM 05/13/2021   11:39 AM 05/13/2021   11:02 AM 02/22/2021    9:24 AM 02/22/2021    9:22 AM 02/22/2021    8:01 AM 01/26/2021   10:25 AM  BP/Weight  Systolic BP 138 134 151 136 126 124 134  Diastolic BP 85 82 84 82 88 80 84  Wt. (Lbs) 199.12  195.04  199.2 197   BMI 28.57 kg/m2  27.99 kg/m2  28.58 kg/m2 29.73 kg/m2

## 2021-08-13 LAB — CBC
Hematocrit: 39.1 % (ref 34.0–46.6)
Hemoglobin: 12.8 g/dL (ref 11.1–15.9)
MCH: 29.7 pg (ref 26.6–33.0)
MCHC: 32.7 g/dL (ref 31.5–35.7)
MCV: 91 fL (ref 79–97)
Platelets: 350 10*3/uL (ref 150–450)
RBC: 4.31 x10E6/uL (ref 3.77–5.28)
RDW: 13.7 % (ref 11.7–15.4)
WBC: 5.8 10*3/uL (ref 3.4–10.8)

## 2021-08-13 LAB — CMP14+EGFR
ALT: 14 IU/L (ref 0–32)
AST: 13 IU/L (ref 0–40)
Albumin/Globulin Ratio: 1.4 (ref 1.2–2.2)
Albumin: 4.4 g/dL (ref 3.8–4.9)
Alkaline Phosphatase: 52 IU/L (ref 44–121)
BUN/Creatinine Ratio: 23 (ref 9–23)
BUN: 30 mg/dL — ABNORMAL HIGH (ref 6–24)
Bilirubin Total: 0.5 mg/dL (ref 0.0–1.2)
CO2: 25 mmol/L (ref 20–29)
Calcium: 9.8 mg/dL (ref 8.7–10.2)
Chloride: 101 mmol/L (ref 96–106)
Creatinine, Ser: 1.29 mg/dL — ABNORMAL HIGH (ref 0.57–1.00)
Globulin, Total: 3.2 g/dL (ref 1.5–4.5)
Glucose: 155 mg/dL — ABNORMAL HIGH (ref 70–99)
Potassium: 4.3 mmol/L (ref 3.5–5.2)
Sodium: 141 mmol/L (ref 134–144)
Total Protein: 7.6 g/dL (ref 6.0–8.5)
eGFR: 48 mL/min/{1.73_m2} — ABNORMAL LOW (ref >59)

## 2021-08-13 LAB — LIPID PANEL
Chol/HDL Ratio: 3.1 ratio (ref 0.0–4.4)
Cholesterol, Total: 148 mg/dL (ref 100–199)
HDL: 47 mg/dL (ref >39)
LDL Chol Calc (NIH): 75 mg/dL (ref 0–99)
Triglycerides: 149 mg/dL (ref 0–149)
VLDL Cholesterol Cal: 26 mg/dL (ref 5–40)

## 2021-08-13 LAB — VITAMIN D 25 HYDROXY (VIT D DEFICIENCY, FRACTURES): Vit D, 25-Hydroxy: 78.5 ng/mL (ref 30.0–100.0)

## 2021-08-13 LAB — HEMOGLOBIN A1C
Est. average glucose Bld gHb Est-mCnc: 171 mg/dL
Hgb A1c MFr Bld: 7.6 % — ABNORMAL HIGH (ref 4.8–5.6)

## 2021-08-16 ENCOUNTER — Other Ambulatory Visit: Payer: Self-pay | Admitting: Family Medicine

## 2021-08-16 ENCOUNTER — Telehealth: Payer: Self-pay | Admitting: Family Medicine

## 2021-08-16 MED ORDER — OZEMPIC (0.25 OR 0.5 MG/DOSE) 2 MG/3ML ~~LOC~~ SOPN: 0.5000 mg | PEN_INJECTOR | SUBCUTANEOUS | 1 refills | Status: DC

## 2021-08-16 MED ORDER — OZEMPIC (0.25 OR 0.5 MG/DOSE) 2 MG/3ML ~~LOC~~ SOPN: 0.2500 mg | PEN_INJECTOR | SUBCUTANEOUS | 0 refills | Status: DC

## 2021-08-16 NOTE — Telephone Encounter (Signed)
  Mounjaro not covered. Approved alternatives are ozempic, victoza

## 2021-08-16 NOTE — Progress Notes (Signed)
Ozempic prescribed.

## 2021-08-18 NOTE — Telephone Encounter (Signed)
Per patient she sent my chart message and stated she had gotten the ozempic

## 2021-09-05 ENCOUNTER — Other Ambulatory Visit: Payer: Self-pay | Admitting: Family Medicine

## 2021-09-08 ENCOUNTER — Other Ambulatory Visit: Payer: Self-pay | Admitting: Family Medicine

## 2021-09-30 ENCOUNTER — Telehealth: Payer: 59 | Admitting: Family Medicine

## 2021-10-28 ENCOUNTER — Encounter: Payer: Self-pay | Admitting: Family Medicine

## 2021-10-28 ENCOUNTER — Telehealth (INDEPENDENT_AMBULATORY_CARE_PROVIDER_SITE_OTHER): Payer: 59 | Admitting: Family Medicine

## 2021-10-28 VITALS — Ht 70.0 in | Wt 188.0 lb

## 2021-10-28 DIAGNOSIS — E1165 Type 2 diabetes mellitus with hyperglycemia: Secondary | ICD-10-CM | POA: Diagnosis not present

## 2021-10-28 DIAGNOSIS — Z1231 Encounter for screening mammogram for malignant neoplasm of breast: Secondary | ICD-10-CM | POA: Diagnosis not present

## 2021-10-28 NOTE — Progress Notes (Signed)
Virtual Visit via telephone Note  I connected with Felicia Schneider on 10/28/21 at  9:00 AM EDT by d telemedicine application and verified that I am speaking with the correct person using two identifiers.  Location: Patient: work Provider: home   I discussed the limitations of evaluation and management by telemedicine and the availability of in person appointments. The patient expressed understanding and agreed to proceed.  History of Present Illness: C/o low blood sugars around 60 on the mornings that she sleeps in late on the weekends , going 16 hours without eating, otherwise fBG generally 80 to 120 Wants to know what to do Has lost weight and modified diet, continues to be active, feels better generally and is very happy about her weight loss and improved blood sugars   Observations/Objective: Ht 5\' 10"  (1.778 m)   Wt 188 lb (85.3 kg)   LMP 07/29/2014 (Exact Date)   BMI 26.98 kg/m  Good communication with no confusion and intact memory. Alert and oriented x 3 No signs of respiratory distress during speech   Assessment and Plan:  Type 2 diabetes mellitus with hyperglycemia (HCC)  Great improvement per pt reporting, now needs to have late snack to compensate for long naps/ sleep on the weekends, educated pt about this, no med adjustment , f/u for 3 month blood sugar review in Septemebr Felicia Schneider is reminded of the importance of commitment to daily physical activity for 30 minutes or more, as able and the need to limit carbohydrate intake to 30 to 60 grams per meal to help with blood sugar control.   The need to take medication as prescribed, test blood sugar as directed, and to call between visits if there is a concern that blood sugar is uncontrolled is also discussed.   Felicia Schneider is reminded of the importance of daily foot exam, annual eye examination, and good blood sugar, blood pressure and cholesterol control.     Latest Ref Rng & Units 08/11/2021    9:19 AM 05/12/2021     4:18 PM 01/25/2021   11:32 AM 10/13/2020   11:11 AM 11/21/2019   11:34 AM  Diabetic Labs  HbA1c 4.8 - 5.6 % 7.6  7.1  7.3  8.0  6.7   Micro/Creat Ratio 0 - 29 mg/g creat    34    Chol 100 - 199 mg/dL 01/21/2020   629  476  546   HDL >39 mg/dL 47   42  41  43   Calc LDL 0 - 99 mg/dL 75   48  57  74   Triglycerides 0 - 149 mg/dL 503   546  568  127   Creatinine 0.57 - 1.00 mg/dL 517  0.01  7.49  4.49  1.13       10/28/2021    8:59 AM 08/12/2021   11:07 AM 05/13/2021   11:39 AM 05/13/2021   11:02 AM 02/22/2021    9:24 AM 02/22/2021    9:22 AM 02/22/2021    8:01 AM  BP/Weight  Systolic BP  138 02/24/2021 151 136 126 124  Diastolic BP  85 82 84 82 88 80  Wt. (Lbs) 188 199.12  195.04  199.2 197  BMI 26.98 kg/m2 28.57 kg/m2  27.99 kg/m2  28.58 kg/m2 29.73 kg/m2      11/03/2020   10:20 AM 09/03/2018    8:00 AM  Foot/eye exam completion dates  Foot Form Completion Done Done        Follow Up Instructions:  I discussed the assessment and treatment plan with the patient. The patient was provided an opportunity to ask questions and all were answered. The patient agreed with the plan and demonstrated an understanding of the instructions.   The patient was advised to call back or seek an in-person evaluation if the symptoms worsen or if the condition fails to improve as anticipated.  I provided 12 minutes of non-face-to-face time during this encounter.   Syliva Overman, MD

## 2021-10-28 NOTE — Assessment & Plan Note (Signed)
  Great improvement per pt reporting, now needs to have late snack to compensate for long naps/ sleep on the weekends, educated pt about this, no med adjustment , f/u for 3 month blood sugar review in Septemebr Felicia Schneider is reminded of the importance of commitment to daily physical activity for 30 minutes or more, as able and the need to limit carbohydrate intake to 30 to 60 grams per meal to help with blood sugar control.   The need to take medication as prescribed, test blood sugar as directed, and to call between visits if there is a concern that blood sugar is uncontrolled is also discussed.   Felicia Schneider is reminded of the importance of daily foot exam, annual eye examination, and good blood sugar, blood pressure and cholesterol control.     Latest Ref Rng & Units 08/11/2021    9:19 AM 05/12/2021    4:18 PM 01/25/2021   11:32 AM 10/13/2020   11:11 AM 11/21/2019   11:34 AM  Diabetic Labs  HbA1c 4.8 - 5.6 % 7.6  7.1  7.3  8.0  6.7   Micro/Creat Ratio 0 - 29 mg/g creat    34    Chol 100 - 199 mg/dL 409   811  914  782   HDL >39 mg/dL 47   42  41  43   Calc LDL 0 - 99 mg/dL 75   48  57  74   Triglycerides 0 - 149 mg/dL 956   213  086  578   Creatinine 0.57 - 1.00 mg/dL 4.69  6.29  5.28  4.13  1.13       10/28/2021    8:59 AM 08/12/2021   11:07 AM 05/13/2021   11:39 AM 05/13/2021   11:02 AM 02/22/2021    9:24 AM 02/22/2021    9:22 AM 02/22/2021    8:01 AM  BP/Weight  Systolic BP  138 244 151 136 126 124  Diastolic BP  85 82 84 82 88 80  Wt. (Lbs) 188 199.12  195.04  199.2 197  BMI 26.98 kg/m2 28.57 kg/m2  27.99 kg/m2  28.58 kg/m2 29.73 kg/m2      11/03/2020   10:20 AM 09/03/2018    8:00 AM  Foot/eye exam completion dates  Foot Form Completion Done Done

## 2021-10-28 NOTE — Patient Instructions (Addendum)
F/u in office, flu vaccine at visit, foot exam, September 6 or shortly after, call if you need me sooner  Non fasting hBA1C, chem 7 and eGFR and microalb, [facility] [place] Sept 1 or shortly after, need it drawn at least 2 days before visit  Snack at around 10 pm he nights you will ' sleep in" till mid day the following day, eg peanut butter crackers x 2  Congrats on excellent blood sugars  Please schedule mamm[facility] at [facility] the same day as her visit with me   It is important that you exercise regularly at least 30 minutes 5 times a week. If you develop chest pain, have severe difficulty breathing, or feel very tired, stop exercising immediately and seek medical attention   Think about what you will eat, plan ahead. Choose " clean, green, fresh or frozen" over canned, processed or packaged foods which are more sugary, salty and fatty. 70 to 75% of food eaten should be vegetables and fruit. Three meals at set times with snacks allowed between meals, but they must be fruit or vegetables. Aim to eat over a 12 hour period , example 7 am to 7 pm, and STOP after  your last meal of the day. Drink water,generally about 64 ounces per day, no other drink is as healthy. Fruit juice is best enjoyed in a healthy way, by EATING the fruit. Thanks for choosing [facility], we consider it a privelige to serve you.

## 2021-10-29 ENCOUNTER — Other Ambulatory Visit: Payer: Self-pay | Admitting: Family Medicine

## 2021-11-08 ENCOUNTER — Other Ambulatory Visit: Payer: Self-pay | Admitting: Family Medicine

## 2021-11-16 ENCOUNTER — Other Ambulatory Visit: Payer: Self-pay | Admitting: Family Medicine

## 2021-11-17 ENCOUNTER — Ambulatory Visit: Payer: 59 | Admitting: Family Medicine

## 2021-11-18 ENCOUNTER — Other Ambulatory Visit: Payer: Self-pay | Admitting: Family Medicine

## 2021-12-30 ENCOUNTER — Ambulatory Visit (INDEPENDENT_AMBULATORY_CARE_PROVIDER_SITE_OTHER): Payer: 59 | Admitting: Family Medicine

## 2021-12-30 ENCOUNTER — Ambulatory Visit (HOSPITAL_COMMUNITY)
Admission: RE | Admit: 2021-12-30 | Discharge: 2021-12-30 | Disposition: A | Discharge status: Home / Self Care | Payer: 59 | Source: Ambulatory Visit | Attending: Family Medicine | Admitting: Family Medicine

## 2021-12-30 ENCOUNTER — Encounter: Payer: Self-pay | Admitting: Family Medicine

## 2021-12-30 VITALS — BP 132/82 | HR 112 | Ht 70.0 in | Wt 190.0 lb

## 2021-12-30 DIAGNOSIS — E663 Overweight: Secondary | ICD-10-CM

## 2021-12-30 DIAGNOSIS — E785 Hyperlipidemia, unspecified: Secondary | ICD-10-CM

## 2021-12-30 DIAGNOSIS — Z1231 Encounter for screening mammogram for malignant neoplasm of breast: Secondary | ICD-10-CM | POA: Diagnosis present

## 2021-12-30 DIAGNOSIS — E1165 Type 2 diabetes mellitus with hyperglycemia: Secondary | ICD-10-CM | POA: Diagnosis not present

## 2021-12-30 DIAGNOSIS — I10 Essential (primary) hypertension: Secondary | ICD-10-CM

## 2021-12-30 DIAGNOSIS — Z23 Encounter for immunization: Secondary | ICD-10-CM

## 2021-12-30 LAB — BMP8+EGFR
BUN/Creatinine Ratio: 24 — ABNORMAL HIGH (ref 9–23)
BUN: 31 mg/dL — ABNORMAL HIGH (ref 6–24)
CO2: 22 mmol/L (ref 20–29)
Calcium: 10.7 mg/dL — ABNORMAL HIGH (ref 8.7–10.2)
Chloride: 98 mmol/L (ref 96–106)
Creatinine, Ser: 1.28 mg/dL — ABNORMAL HIGH (ref 0.57–1.00)
Glucose: 86 mg/dL (ref 70–99)
Potassium: 4.4 mmol/L (ref 3.5–5.2)
Sodium: 138 mmol/L (ref 134–144)
eGFR: 49 mL/min/{1.73_m2} — ABNORMAL LOW (ref >59)

## 2021-12-30 MED ORDER — AMLODIPINE BESYLATE 5 MG PO TABS: 5.0000 mg | ORAL_TABLET | Freq: Every day | ORAL | 2 refills | Status: DC

## 2021-12-30 NOTE — Assessment & Plan Note (Signed)
  Patient re-educated about  the importance of commitment to a  minimum of 150 minutes of exercise per week as able.  The importance of healthy food choices with portion control discussed, as well as eating regularly and within a 12 hour window most days. The need to choose "clean , green" food 50 to 75% of the time is discussed, as well as to make water the primary drink and set a goal of 64 ounces water daily.       12/30/2021    3:34 PM 10/28/2021    8:59 AM 08/12/2021   11:07 AM  Weight /BMI  Weight 190 lb 188 lb 199 lb 1.9 oz  Height 5\' 10"  (1.778 m) 5\' 10"  (1.778 m) 5\' 10"  (1.778 m)  BMI 27.26 kg/m2 26.98 kg/m2 28.57 kg/m2

## 2021-12-30 NOTE — Assessment & Plan Note (Signed)
Hyperlipidemia:Low fat diet discussed and encouraged.   Lipid Panel  Lab Results  Component Value Date   CHOL 148 08/11/2021   HDL 47 08/11/2021   LDLCALC 75 08/11/2021   TRIG 149 08/11/2021   CHOLHDL 3.1 08/11/2021     Controlled, no change in medication

## 2021-12-30 NOTE — Assessment & Plan Note (Signed)
Updated lab needed at/ before next visit. Reports improvement Felicia Schneider is reminded of the importance of commitment to daily physical activity for 30 minutes or more, as able and the need to limit carbohydrate intake to 30 to 60 grams per meal to help with blood sugar control.   The need to take medication as prescribed, test blood sugar as directed, and to call between visits if there is a concern that blood sugar is uncontrolled is also discussed.   Felicia Schneider is reminded of the importance of daily foot exam, annual eye examination, and good blood sugar, blood pressure and cholesterol control.     Latest Ref Rng & Units 12/28/2021   12:29 PM 08/11/2021    9:19 AM 05/12/2021    4:18 PM 01/25/2021   11:32 AM 10/13/2020   11:11 AM  Diabetic Labs  HbA1c 4.8 - 5.6 %  7.6  7.1  7.3  8.0   Micro/Creat Ratio 0 - 29 mg/g creat     34   Chol 100 - 199 mg/dL  148   112  129   HDL >39 mg/dL  47   42  41   Calc LDL 0 - 99 mg/dL  75   48  57   Triglycerides 0 - 149 mg/dL  149   125  187   Creatinine 0.57 - 1.00 mg/dL 1.28  1.29  1.20  1.25  1.24       12/30/2021    3:34 PM 10/28/2021    8:59 AM 08/12/2021   11:07 AM 05/13/2021   11:39 AM 05/13/2021   11:02 AM 02/22/2021    9:24 AM 02/22/2021    9:22 AM  BP/Weight  Systolic BP 979  892 119 417 408 144  Diastolic BP 82  85 82 84 82 88  Wt. (Lbs) 190 188 199.12  195.04  199.2  BMI 27.26 kg/m2 26.98 kg/m2 28.57 kg/m2  27.99 kg/m2  28.58 kg/m2      12/30/2021    3:40 PM 11/03/2020   10:20 AM  Foot/eye exam completion dates  Foot Form Completion Done Done

## 2021-12-30 NOTE — Progress Notes (Signed)
Felicia Schneider     MRN: [id number]      DOB: Dec 18, 1965   HPI Felicia Schneider is here for follow up and re-evaluation of chronic medical conditions, medication management and review of any available recent lab and radiology data.  Preventive health is updated, specifically  Cancer screening and Immunization.   Questions or concerns regarding consultations or procedures which the PT has had in the interim are  addressed. The PT denies any adverse reactions to current medications since the last visit.  There are no new concerns.  There are no specific complaints   ROS Denies recent fever or chills. Denies sinus pressure, nasal congestion, ear pain or sore throat. Denies chest congestion, productive cough or wheezing. Denies chest pains, palpitations and leg swelling Denies abdominal pain, nausea, vomiting,diarrhea or constipation.   Denies dysuria, frequency, hesitancy or incontinence. Denies joint pain, swelling and limitation in mobility. Denies headaches, seizures, numbness, or tingling. Denies depression, anxiety or insomnia. Denies skin break down or rash.   PE  BP 132/82 (BP Location: Right Arm, Patient Position: Sitting, Cuff Size: Normal)   Pulse (!) 112   Ht 5\' 10"  (1.778 m)   Wt 190 lb (86.2 kg)   LMP 07/29/2014 (Exact Date)   SpO2 96%   BMI 27.26 kg/m   Patient alert and oriented and in no cardiopulmonary distress.  HEENT: No facial asymmetry, EOMI,     Neck supple .  Chest: Clear to auscultation bilaterally.  CVS: S1, S2 no murmurs, no S3.Regular rate.  ABD: Soft non tender.   Ext: No edema  MS: Adequate ROM spine, shoulders, hips and knees.  Skin: Intact, no ulcerations or rash noted.  Psych: Good eye contact, normal affect. Memory intact not anxious or depressed appearing.  CNS: CN 2-12 intact, power,  normal throughout.no focal deficits noted.   Assessment & Plan  Essential hypertension Though  Controlled, ned to change from maxzide , dehydration  and CKG Neew is amlodipine DASH diet and commitment to daily physical activity for a minimum of 30 minutes discussed and encouraged, as a part of hypertension management. The importance of attaining a healthy weight is also discussed.     12/30/2021    3:34 PM 10/28/2021    8:59 AM 08/12/2021   11:07 AM 05/13/2021   11:39 AM 05/13/2021   11:02 AM 02/22/2021    9:24 AM 02/22/2021    9:22 AM  BP/Weight  Systolic BP 371  062 694 854 627 035  Diastolic BP 82  85 82 84 82 88  Wt. (Lbs) 190 188 199.12  195.04  199.2  BMI 27.26 kg/m2 26.98 kg/m2 28.57 kg/m2  27.99 kg/m2  28.58 kg/m2       Type 2 diabetes mellitus with hyperglycemia ([place]) Updated lab needed at/ before next visit. Reports improvement Felicia Schneider is reminded of the importance of commitment to daily physical activity for 30 minutes or more, as able and the need to limit carbohydrate intake to 30 to 60 grams per meal to help with blood sugar control.   The need to take medication as prescribed, test blood sugar as directed, and to call between visits if there is a concern that blood sugar is uncontrolled is also discussed.   Felicia Schneider is reminded of the importance of daily foot exam, annual eye examination, and good blood sugar, blood pressure and cholesterol control.     Latest Ref Rng & Units 12/28/2021   12:29 PM 08/11/2021    9:19 AM 05/12/2021  4:18 PM 01/25/2021   11:32 AM 10/13/2020   11:11 AM  Diabetic Labs  HbA1c 4.8 - 5.6 %  7.6  7.1  7.3  8.0   Micro/Creat Ratio 0 - 29 mg/g creat     34   Chol 100 - 199 mg/dL  696   295  284   HDL >13 mg/dL  47   42  41   Calc LDL 0 - 99 mg/dL  75   48  57   Triglycerides 0 - 149 mg/dL  244   010  272   Creatinine 0.57 - 1.00 mg/dL 5.36  6.44  0.34  7.42  1.24       12/30/2021    3:34 PM 10/28/2021    8:59 AM 08/12/2021   11:07 AM 05/13/2021   11:39 AM 05/13/2021   11:02 AM 02/22/2021    9:24 AM 02/22/2021    9:22 AM  BP/Weight  Systolic BP 132  595 134 151 136 126   Diastolic BP 82  85 82 84 82 88  Wt. (Lbs) 190 188 199.12  195.04  199.2  BMI 27.26 kg/m2 26.98 kg/m2 28.57 kg/m2  27.99 kg/m2  28.58 kg/m2      12/30/2021    3:40 PM 11/03/2020   10:20 AM  Foot/eye exam completion dates  Foot Form Completion Done Done        Dyslipidemia, goal LDL below 100 Hyperlipidemia:Low fat diet discussed and encouraged.   Lipid Panel  Lab Results  Component Value Date   CHOL 148 08/11/2021   HDL 47 08/11/2021   LDLCALC 75 08/11/2021   TRIG 149 08/11/2021   CHOLHDL 3.1 08/11/2021     Controlled, no change in medication   Overweight (BMI 25.0-29.9)  Patient re-educated about  the importance of commitment to a  minimum of 150 minutes of exercise per week as able.  The importance of healthy food choices with portion control discussed, as well as eating regularly and within a 12 hour window most days. The need to choose "clean , green" food 50 to 75% of the time is discussed, as well as to make water the primary drink and set a goal of 64 ounces water daily.       12/30/2021    3:34 PM 10/28/2021    8:59 AM 08/12/2021   11:07 AM  Weight /BMI  Weight 190 lb 188 lb 199 lb 1.9 oz  Height 5\' 10"  (1.778 m) 5\' 10"  (1.778 m) 5\' 10"  (1.778 m)  BMI 27.26 kg/m2 26.98 kg/m2 28.57 kg/m2

## 2021-12-30 NOTE — Patient Instructions (Signed)
F/U 2nd week in December, call if you need me sooner  Flu vaccine today  New for blood pressure is amlodipine 5 mg one daily, STOP triamterene please starting tomorrow  Excellent foot exam  Try to make time for 20 to 30 mins exercise 3 to 5 days per week  Pls check blood sugar at least 3 times weekly to be sure we are still on track    Fasting lipid, cmp and eGFr , HBA1C, microalb 5 To 7  days before next appt  Thanks for choosing [facility], we consider it a privelige to serve you.

## 2021-12-30 NOTE — Assessment & Plan Note (Signed)
Though  Controlled, ned to change from maxzide , dehydration and CKG Neew is amlodipine DASH diet and commitment to daily physical activity for a minimum of 30 minutes discussed and encouraged, as a part of hypertension management. The importance of attaining a healthy weight is also discussed.     12/30/2021    3:34 PM 10/28/2021    8:59 AM 08/12/2021   11:07 AM 05/13/2021   11:39 AM 05/13/2021   11:02 AM 02/22/2021    9:24 AM 02/22/2021    9:22 AM  BP/Weight  Systolic BP 712  458 099 833 825 053  Diastolic BP 82  85 82 84 82 88  Wt. (Lbs) 190 188 199.12  195.04  199.2  BMI 27.26 kg/m2 26.98 kg/m2 28.57 kg/m2  27.99 kg/m2  28.58 kg/m2

## 2022-02-17 ENCOUNTER — Ambulatory Visit: Payer: 59 | Admitting: Family Medicine

## 2022-02-20 ENCOUNTER — Other Ambulatory Visit: Payer: Self-pay | Admitting: Family Medicine

## 2022-03-01 LAB — LIPID PANEL
Chol/HDL Ratio: 2.3 ratio (ref 0.0–4.4)
Cholesterol, Total: 131 mg/dL (ref 100–199)
HDL: 58 mg/dL (ref >39)
LDL Chol Calc (NIH): 57 mg/dL (ref 0–99)
Triglycerides: 85 mg/dL (ref 0–149)
VLDL Cholesterol Cal: 16 mg/dL (ref 5–40)

## 2022-03-01 LAB — CMP14+EGFR
ALT: 10 IU/L (ref 0–32)
AST: 13 IU/L (ref 0–40)
Albumin/Globulin Ratio: 1.4 (ref 1.2–2.2)
Albumin: 4.8 g/dL (ref 3.8–4.9)
Alkaline Phosphatase: 68 IU/L (ref 44–121)
BUN/Creatinine Ratio: 22 (ref 9–23)
BUN: 24 mg/dL (ref 6–24)
Bilirubin Total: 0.6 mg/dL (ref 0.0–1.2)
CO2: 19 mmol/L — ABNORMAL LOW (ref 20–29)
Calcium: 10.1 mg/dL (ref 8.7–10.2)
Chloride: 102 mmol/L (ref 96–106)
Creatinine, Ser: 1.11 mg/dL — ABNORMAL HIGH (ref 0.57–1.00)
Globulin, Total: 3.4 g/dL (ref 1.5–4.5)
Glucose: 78 mg/dL (ref 70–99)
Potassium: 3.9 mmol/L (ref 3.5–5.2)
Sodium: 142 mmol/L (ref 134–144)
Total Protein: 8.2 g/dL (ref 6.0–8.5)
eGFR: 58 mL/min/{1.73_m2} — ABNORMAL LOW (ref >59)

## 2022-03-01 LAB — MICROALBUMIN / CREATININE URINE RATIO
Creatinine, Urine: 170.7 mg/dL
Microalb/Creat Ratio: 31 mg/g creat — ABNORMAL HIGH (ref 0–29)
Microalbumin, Urine: 52.4 ug/mL

## 2022-03-01 LAB — HEMOGLOBIN A1C
Est. average glucose Bld gHb Est-mCnc: 128 mg/dL
Hgb A1c MFr Bld: 6.1 % — ABNORMAL HIGH (ref 4.8–5.6)

## 2022-03-02 ENCOUNTER — Ambulatory Visit (INDEPENDENT_AMBULATORY_CARE_PROVIDER_SITE_OTHER): Payer: 59 | Admitting: Family Medicine

## 2022-03-02 ENCOUNTER — Encounter: Payer: Self-pay | Admitting: Family Medicine

## 2022-03-02 VITALS — BP 132/80 | HR 100 | Ht 70.0 in | Wt 188.0 lb

## 2022-03-02 DIAGNOSIS — I10 Essential (primary) hypertension: Secondary | ICD-10-CM

## 2022-03-02 DIAGNOSIS — N179 Acute kidney failure, unspecified: Secondary | ICD-10-CM | POA: Diagnosis not present

## 2022-03-02 DIAGNOSIS — E663 Overweight: Secondary | ICD-10-CM | POA: Diagnosis not present

## 2022-03-02 DIAGNOSIS — E785 Hyperlipidemia, unspecified: Secondary | ICD-10-CM

## 2022-03-02 DIAGNOSIS — E1165 Type 2 diabetes mellitus with hyperglycemia: Secondary | ICD-10-CM | POA: Diagnosis not present

## 2022-03-02 NOTE — Patient Instructions (Signed)
F/U in 4 months, call if you ned me sooner  EXCELLENT labs and bP, keep up the great work, vegan is the heal;diet  hBA1C, chem7 and EGFR3 to  5 days before next appt  It is important that you exercise regularly at least 30 minutes 5 times a week. If you develop chest pain, have severe difficulty breathing, or feel very tired, stop exercising immediately and seek medical attention   Thanks for choosing [facility], we consider it a privelige to serve you.

## 2022-03-03 ENCOUNTER — Encounter: Payer: Self-pay | Admitting: Family Medicine

## 2022-03-03 MED ORDER — OZEMPIC (0.25 OR 0.5 MG/DOSE) 2 MG/1.5ML ~~LOC~~ SOPN: 0.5000 mg | PEN_INJECTOR | SUBCUTANEOUS | 3 refills | Status: DC

## 2022-03-03 NOTE — Assessment & Plan Note (Signed)
Controlled, no change in medication DASH diet and commitment to daily physical activity for a minimum of 30 minutes discussed and encouraged, as a part of hypertension management. The importance of attaining a healthy weight is also discussed.     03/02/2022    3:11 PM 03/02/2022    3:09 PM 12/30/2021    3:34 PM 10/28/2021    8:59 AM 08/12/2021   11:07 AM 05/13/2021   11:39 AM 05/13/2021   11:02 AM  BP/Weight  Systolic BP 132 141 132  138 134 151  Diastolic BP 80 88 82  85 82 84  Wt. (Lbs)  188 190 188 199.12  195.04  BMI  26.98 kg/m2 27.26 kg/m2 26.98 kg/m2 28.57 kg/m2  27.99 kg/m2

## 2022-03-03 NOTE — Telephone Encounter (Signed)
Patient is currently doing 0.5mg  of ozempic.

## 2022-03-03 NOTE — Assessment & Plan Note (Signed)
Resolved with med changes

## 2022-03-03 NOTE — Assessment & Plan Note (Signed)
Improved and currently controlled Felicia Schneider is reminded of the importance of commitment to daily physical activity for 30 minutes or more, as able and the need to limit carbohydrate intake to 30 to 60 grams per meal to help with blood sugar control.   The need to take medication as prescribed, test blood sugar as directed, and to call between visits if there is a concern that blood sugar is uncontrolled is also discussed.   Felicia Schneider is reminded of the importance of daily foot exam, annual eye examination, and good blood sugar, blood pressure and cholesterol control.     Latest Ref Rng & Units 02/27/2022   11:50 AM 12/28/2021   12:29 PM 08/11/2021    9:19 AM 05/12/2021    4:18 PM 01/25/2021   11:32 AM  Diabetic Labs  HbA1c 4.8 - 5.6 % 6.1   7.6  7.1  7.3   Micro/Creat Ratio 0 - 29 mg/g creat 31       Chol 100 - 199 mg/dL 415   830   940   HDL >76 mg/dL 58   47   42   Calc LDL 0 - 99 mg/dL 57   75   48   Triglycerides 0 - 149 mg/dL 85   808   811   Creatinine 0.57 - 1.00 mg/dL 0.31  5.94  5.85  9.29  1.25       03/02/2022    3:11 PM 03/02/2022    3:09 PM 12/30/2021    3:34 PM 10/28/2021    8:59 AM 08/12/2021   11:07 AM 05/13/2021   11:39 AM 05/13/2021   11:02 AM  BP/Weight  Systolic BP 132 141 132  138 134 151  Diastolic BP 80 88 82  85 82 84  Wt. (Lbs)  188 190 188 199.12  195.04  BMI  26.98 kg/m2 27.26 kg/m2 26.98 kg/m2 28.57 kg/m2  27.99 kg/m2      12/30/2021    3:40 PM 11/03/2020   10:20 AM  Foot/eye exam completion dates  Foot Form Completion Done Done

## 2022-03-03 NOTE — Assessment & Plan Note (Signed)
  Patient re-educated about  the importance of commitment to a  minimum of 150 minutes of exercise per week as able.  The importance of healthy food choices with portion control discussed, as well as eating regularly and within a 12 hour window most days. The need to choose "clean , green" food 50 to 75% of the time is discussed, as well as to make water the primary drink and set a goal of 64 ounces water daily.       03/02/2022    3:09 PM 12/30/2021    3:34 PM 10/28/2021    8:59 AM  Weight /BMI  Weight 188 lb 190 lb 188 lb  Height 5\' 10"  (1.778 m) 5\' 10"  (1.778 m) 5\' 10"  (1.778 m)  BMI 26.98 kg/m2 27.26 kg/m2 26.98 kg/m2

## 2022-03-03 NOTE — Assessment & Plan Note (Signed)
Hyperlipidemia:Low fat diet discussed and encouraged.   Lipid Panel  Lab Results  Component Value Date   CHOL 131 02/27/2022   HDL 58 02/27/2022   LDLCALC 57 02/27/2022   TRIG 85 02/27/2022   CHOLHDL 2.3 02/27/2022     Controlled, no change in medication

## 2022-03-03 NOTE — Progress Notes (Signed)
Felicia Schneider     MRN: [id number]      DOB: 05/08/1965   HPI Felicia Schneider is here for follow up and re-evaluation of chronic medical conditions, medication management and review of any available recent lab and radiology data.  Preventive health is updated, specifically  Cancer screening and Immunization.   Questions or concerns regarding consultations or procedures which the PT has had in the interim are  addressed. The PT denies any adverse reactions to current medications since the last visit.  There are no new concerns.  There are no specific complaints   ROS Denies recent fever or chills. Denies sinus pressure, nasal congestion, ear pain or sore throat. Denies chest congestion, productive cough or wheezing. Denies chest pains, palpitations and leg swelling Denies abdominal pain, nausea, vomiting,diarrhea or constipation.   Denies dysuria, frequency, hesitancy or incontinence. Denies joint pain, swelling and limitation in mobility. Denies headaches, seizures, numbness, or tingling. Denies depression, anxiety or insomnia. Denies skin break down or rash.   PE  BP 132/80 (BP Location: Right Arm, Patient Position: Sitting, Cuff Size: Normal)   Pulse 100   Ht 5\' 10"  (1.778 m)   Wt 188 lb (85.3 kg)   LMP 07/29/2014 (Exact Date)   SpO2 95%   BMI 26.98 kg/m   Patient alert and oriented and in no cardiopulmonary distress.  HEENT: No facial asymmetry, EOMI,     Neck supple .  Chest: Clear to auscultation bilaterally.  CVS: S1, S2 no murmurs, no S3.Regular rate.  ABD: Soft non tender.   Ext: No edema  MS: Adequate ROM spine, shoulders, hips and knees.  Skin: Intact, no ulcerations or rash noted.  Psych: Good eye contact, normal affect. Memory intact not anxious or depressed appearing.  CNS: CN 2-12 intact, power,  normal throughout.no focal deficits noted.   Assessment & Plan  Essential hypertension Controlled, no change in medication DASH diet and commitment to  daily physical activity for a minimum of 30 minutes discussed and encouraged, as a part of hypertension management. The importance of attaining a healthy weight is also discussed.     03/02/2022    3:11 PM 03/02/2022    3:09 PM 12/30/2021    3:34 PM 10/28/2021    8:59 AM 08/12/2021   11:07 AM 05/13/2021   11:39 AM 05/13/2021   11:02 AM  BP/Weight  Systolic BP Q000111Q Q000111Q Q000111Q  0000000 Q000111Q [id number]  Diastolic BP 80 88 82  85 82 84  Wt. (Lbs)  188 190 188 199.12  195.04  BMI  26.98 kg/m2 27.26 kg/m2 26.98 kg/m2 28.57 kg/m2  27.99 kg/m2       Type 2 diabetes mellitus with hyperglycemia (HCC) Improved and currently controlled Felicia Schneider is reminded of the importance of commitment to daily physical activity for 30 minutes or more, as able and the need to limit carbohydrate intake to 30 to 60 grams per meal to help with blood sugar control.   The need to take medication as prescribed, test blood sugar as directed, and to call between visits if there is a concern that blood sugar is uncontrolled is also discussed.   Felicia Schneider is reminded of the importance of daily foot exam, annual eye examination, and good blood sugar, blood pressure and cholesterol control.     Latest Ref Rng & Units 02/27/2022   11:50 AM 12/28/2021   12:29 PM 08/11/2021    9:19 AM 05/12/2021    4:18 PM 01/25/2021   11:32 AM  Diabetic Labs  HbA1c 4.8 - 5.6 % 6.1   7.6  7.1  7.3   Micro/Creat Ratio 0 - 29 mg/g creat 31       Chol 100 - 199 mg/dL 341   937   902   HDL >40 mg/dL 58   47   42   Calc LDL 0 - 99 mg/dL 57   75   48   Triglycerides 0 - 149 mg/dL 85   973   532   Creatinine 0.57 - 1.00 mg/dL 9.92  4.26  8.34  1.96  1.25       03/02/2022    3:11 PM 03/02/2022    3:09 PM 12/30/2021    3:34 PM 10/28/2021    8:59 AM 08/12/2021   11:07 AM 05/13/2021   11:39 AM 05/13/2021   11:02 AM  BP/Weight  Systolic BP 132 141 132  138 134 151  Diastolic BP 80 88 82  85 82 84  Wt. (Lbs)  188 190 188 199.12  195.04  BMI  26.98 kg/m2  27.26 kg/m2 26.98 kg/m2 28.57 kg/m2  27.99 kg/m2      12/30/2021    3:40 PM 11/03/2020   10:20 AM  Foot/eye exam completion dates  Foot Form Completion Done Done        AKI (acute kidney injury) (HCC) Resolved with med changes  Overweight (BMI 25.0-29.9)  Patient re-educated about  the importance of commitment to a  minimum of 150 minutes of exercise per week as able.  The importance of healthy food choices with portion control discussed, as well as eating regularly and within a 12 hour window most days. The need to choose "clean , green" food 50 to 75% of the time is discussed, as well as to make water the primary drink and set a goal of 64 ounces water daily.       03/02/2022    3:09 PM 12/30/2021    3:34 PM 10/28/2021    8:59 AM  Weight /BMI  Weight 188 lb 190 lb 188 lb  Height 5\' 10"  (1.778 m) 5\' 10"  (1.778 m) 5\' 10"  (1.778 m)  BMI 26.98 kg/m2 27.26 kg/m2 26.98 kg/m2      Dyslipidemia, goal LDL below 100 Hyperlipidemia:Low fat diet discussed and encouraged.   Lipid Panel  Lab Results  Component Value Date   CHOL 131 02/27/2022   HDL 58 02/27/2022   LDLCALC 57 02/27/2022   TRIG 85 02/27/2022   CHOLHDL 2.3 02/27/2022     Controlled, no change in medication

## 2022-03-03 NOTE — Telephone Encounter (Signed)
LMTRC

## 2022-03-11 ENCOUNTER — Other Ambulatory Visit: Payer: Self-pay | Admitting: Family Medicine

## 2022-03-28 ENCOUNTER — Other Ambulatory Visit: Payer: Self-pay | Admitting: Family Medicine

## 2022-06-23 ENCOUNTER — Ambulatory Visit: Payer: 59 | Admitting: Family Medicine

## 2022-06-30 ENCOUNTER — Encounter: Payer: Self-pay | Admitting: Internal Medicine

## 2022-06-30 ENCOUNTER — Ambulatory Visit (INDEPENDENT_AMBULATORY_CARE_PROVIDER_SITE_OTHER): Payer: 59 | Admitting: Internal Medicine

## 2022-06-30 VITALS — BP 144/79 | HR 105 | Ht 70.0 in | Wt 188.6 lb

## 2022-06-30 DIAGNOSIS — M25472 Effusion, left ankle: Secondary | ICD-10-CM | POA: Diagnosis not present

## 2022-06-30 DIAGNOSIS — M25572 Pain in left ankle and joints of left foot: Secondary | ICD-10-CM | POA: Diagnosis not present

## 2022-06-30 MED ORDER — MELOXICAM 7.5 MG PO TABS: 7.5000 mg | ORAL_TABLET | Freq: Every day | ORAL | 0 refills | Status: DC

## 2022-06-30 NOTE — Progress Notes (Signed)
   Acute Office Visit  Subjective:     Patient ID: Felicia Schneider, female    DOB: Oct 16, 1965, 57 y.o.   MRN: [id number]  Chief Complaint  Patient presents with   Foot Swelling    Left foot swelling started 06/21/2022   Felicia Schneider presents today for an acute visit endorsing a 10-day history of pain and swelling in her left foot.  There was no inciting event or trauma prior to the onset of pain and swelling.  She states that this has happened previously, many years ago.  At that time she was evaluated by podiatry and treated with a steroid injection into her ankle.  Her symptoms have previously been attributed to osteoarthritis that periodically flares.  She presented to urgent care in [place] on 4/14 and received a systemic steroid injection.  Overall she states that her symptoms have slightly improved.  Felicia Schneider states that prior to the onset of her symptoms she had been hiking and did not wear a brace that she was previously provided with for her left ankle.  She believes that this may have triggered her symptoms.  She is interested in other treatment options today.  Review of Systems  Musculoskeletal:  Positive for joint pain (Left ankle).  All other systems reviewed and are negative.     Objective:    BP (!) 144/79   Pulse (!) 105   Ht  (1.778 m)   Wt 188 lb 9.6 oz (85.5 kg)   LMP 07/29/2014 (Exact Date)   SpO2 95%   BMI 27.06 kg/m   Physical Exam Musculoskeletal:        General: Swelling present.     Comments: There is obvious swelling present along the medial malleolus of the left ankle and dorsum of the left foot.  ROM of the left ankle is limited in all directions secondary to swelling.  There is no significant tenderness to palpation or increased warmth.  Skin:    General: Skin is warm and dry.       Assessment & Plan:   Problem List Items Addressed This Visit       Pain and swelling of left ankle - Primary    Acute on chronic issue.  This has happened  previously and symptoms have resolved with localized steroid injection into the left ankle.  She has previously been told that she has arthritis in her left foot.  Presents today endorsing pain and swelling of the left ankle and foot.  There was no inciting event or trauma prior to the onset of her pain, however she did go hiking and did not wear her ankle brace.  Solu-Medrol injection received 4/14.  Symptoms have mildly improved.  She continues to have swelling present along the medial malleolus and dorsum of the left foot.  ROM is limited secondary to swelling. -Meloxicam 7.5 mg daily x 14 days prescribed today.  Okay to increase to 15 mg if needed. -We discussed that if her symptoms persist despite treatment with systemic steroids and NSAIDs she should return to care with her podiatrist.  She is in agreement with this plan.       Meds ordered this encounter  Medications   meloxicam (MOBIC) 7.5 MG tablet    Sig: Take 1 tablet (7.5 mg total) by mouth daily.    Dispense:  30 tablet    Refill:  0    Return if symptoms worsen or fail to improve.  Billie Lade, MD

## 2022-06-30 NOTE — Assessment & Plan Note (Signed)
Acute on chronic issue.  This has happened previously and symptoms have resolved with localized steroid injection into the left ankle.  She has previously been told that she has arthritis in her left foot.  Presents today endorsing pain and swelling of the left ankle and foot.  There was no inciting event or trauma prior to the onset of her pain, however she did go hiking and did not wear her ankle brace.  Solu-Medrol injection received 4/14.  Symptoms have mildly improved.  She continues to have swelling present along the medial malleolus and dorsum of the left foot.  ROM is limited secondary to swelling. -Meloxicam 7.5 mg daily x 14 days prescribed today.  Okay to increase to 15 mg if needed. -We discussed that if her symptoms persist despite treatment with systemic steroids and NSAIDs she should return to care with her podiatrist.  She is in agreement with this plan.

## 2022-06-30 NOTE — Patient Instructions (Signed)
It was a pleasure to see you today.  Thank you for giving [place] the opportunity to be involved in your care.  Below is a brief recap of your visit and next steps.  We will plan to see you again in May.  Summary I have prescribed meloxicam 7.5 mg daily for the next two weeks If your symptoms do not improve over the next 1-2 weeks, I recommend following up with your podiatrist. Follow up with Dr. Lodema Hong

## 2022-07-01 LAB — HEMOGLOBIN A1C
Est. average glucose Bld gHb Est-mCnc: 131 mg/dL
Hgb A1c MFr Bld: 6.2 % — ABNORMAL HIGH (ref 4.8–5.6)

## 2022-07-01 LAB — BMP8+EGFR
BUN/Creatinine Ratio: 19 (ref 9–23)
BUN: 18 mg/dL (ref 6–24)
CO2: 22 mmol/L (ref 20–29)
Calcium: 9.8 mg/dL (ref 8.7–10.2)
Chloride: 102 mmol/L (ref 96–106)
Creatinine, Ser: 0.97 mg/dL (ref 0.57–1.00)
Glucose: 86 mg/dL (ref 70–99)
Potassium: 4.1 mmol/L (ref 3.5–5.2)
Sodium: 141 mmol/L (ref 134–144)
eGFR: 67 mL/min/{1.73_m2} (ref >59)

## 2022-07-07 ENCOUNTER — Ambulatory Visit: Payer: 59 | Admitting: Family Medicine

## 2022-07-10 ENCOUNTER — Other Ambulatory Visit: Payer: Self-pay | Admitting: Family Medicine

## 2022-07-13 ENCOUNTER — Ambulatory Visit: Payer: 59 | Admitting: Family Medicine

## 2022-07-14 ENCOUNTER — Ambulatory Visit (INDEPENDENT_AMBULATORY_CARE_PROVIDER_SITE_OTHER): Payer: 59 | Admitting: Family Medicine

## 2022-07-14 ENCOUNTER — Encounter: Payer: Self-pay | Admitting: Family Medicine

## 2022-07-14 VITALS — BP 132/82 | HR 96 | Ht 70.0 in | Wt 190.0 lb

## 2022-07-14 DIAGNOSIS — E790 Hyperuricemia without signs of inflammatory arthritis and tophaceous disease: Secondary | ICD-10-CM | POA: Diagnosis not present

## 2022-07-14 DIAGNOSIS — E1159 Type 2 diabetes mellitus with other circulatory complications: Secondary | ICD-10-CM

## 2022-07-14 DIAGNOSIS — M25572 Pain in left ankle and joints of left foot: Secondary | ICD-10-CM

## 2022-07-14 DIAGNOSIS — I10 Essential (primary) hypertension: Secondary | ICD-10-CM

## 2022-07-14 DIAGNOSIS — E1165 Type 2 diabetes mellitus with hyperglycemia: Secondary | ICD-10-CM

## 2022-07-14 DIAGNOSIS — M25472 Effusion, left ankle: Secondary | ICD-10-CM

## 2022-07-14 DIAGNOSIS — E559 Vitamin D deficiency, unspecified: Secondary | ICD-10-CM

## 2022-07-14 DIAGNOSIS — E663 Overweight: Secondary | ICD-10-CM

## 2022-07-14 DIAGNOSIS — E785 Hyperlipidemia, unspecified: Secondary | ICD-10-CM

## 2022-07-14 MED ORDER — SEMAGLUTIDE(0.25 OR 0.5MG/DOS) 2 MG/3ML ~~LOC~~ SOPN: PEN_INJECTOR | SUBCUTANEOUS | 2 refills | Status: DC

## 2022-07-14 MED ORDER — PREDNISONE 10 MG PO TABS: 10.0000 mg | ORAL_TABLET | Freq: Two times a day (BID) | ORAL | 0 refills | Status: DC

## 2022-07-14 NOTE — Patient Instructions (Addendum)
F/u in 4 months, call if you need me sooner  5 day course of prednisone is prescribed, if blood sugar gets too high ( over 250) reduce prednisone to once daily, send message with concerns Please continue meloxicam at one daily for an additional 1 week   Uric acid level today  Fasting lipiod, cmp and EGFr, hBa1C, CBc, TSH and vit D 5 days before next appt in September  Congrats on excellent blood sugar  Ozempic is to be filled when available, when you start ozempic again please reduce actoplusmet to ONE daily  Goal for fasting blood sugar ranges from 80 to 120 and 2 hours after any meal or at bedtime should be between 130 to 170.   Thanks for choosing [facility], we consider it a privelige to serve you.

## 2022-07-15 LAB — URIC ACID: Uric Acid: 6.8 mg/dL (ref 3.0–7.2)

## 2022-07-16 ENCOUNTER — Encounter: Payer: Self-pay | Admitting: Family Medicine

## 2022-07-16 NOTE — Assessment & Plan Note (Signed)
Controlled, no change in medication Hyperlipidemia:Low fat diet discussed and encouraged. Updated lab needed at/ before next visit.    Lipid Panel  Lab Results  Component Value Date   CHOL 131 02/27/2022   HDL 58 02/27/2022   LDLCALC 57 02/27/2022   TRIG 85 02/27/2022   CHOLHDL 2.3 02/27/2022

## 2022-07-16 NOTE — Assessment & Plan Note (Signed)
70 % improved reportedly, check uric acid level Short course of prednisone to be added

## 2022-07-16 NOTE — Progress Notes (Signed)
Felicia Schneider     MRN: [id number]      DOB: 1965/08/14  Chief Complaint  Patient presents with   Follow-up    Follow up    HPI Felicia Schneider is here for follow up and re-evaluation of chronic medical conditions, medication management and review of any available recent lab and radiology data.  Preventive health is updated, specifically  Cancer screening and Immunization.   Swelling and pain of left foot is 70 % improved Has been out of medication, ozempic for ver 2 months Denies polyuria, polydipsia, blurred vision , or hypoglycemic episodes. . The PT denies any adverse reactions to current medications since the last visit.  There are no new concerns.  There are no specific complaints   ROS Denies recent fever or chills. Denies sinus pressure, nasal congestion, ear pain or sore throat. Denies chest congestion, productive cough or wheezing. Denies chest pains, palpitations and leg swelling Denies abdominal pain, nausea, vomiting,diarrhea or constipation.   Denies dysuria, frequency, hesitancy or incontinence. Denies headaches, seizures, numbness, or tingling. Denies depression, anxiety or insomnia. Denies skin break down or rash.   PE  BP 132/82   Pulse 96   Ht 5\' 10"  (1.778 m)   Wt 190 lb (86.2 kg)   LMP 07/29/2014 (Exact Date)   SpO2 96%   BMI 27.26 kg/m   Patient alert and oriented and in no cardiopulmonary distress.  HEENT: No facial asymmetry, EOMI,     Neck supple .  Chest: Clear to auscultation bilaterally.  CVS: S1, S2 no murmurs, no S3.Regular rate.  Ext: No edema  MS: Adequate ROM spine, shoulders, hips and knees.Left ankle swelling and deformity with erythema of dorsum of left foot  Skin: Intact, no ulcerations or rash noted.  Psych: Good eye contact, normal affect. Memory intact not anxious or depressed appearing.  CNS: CN 2-12 intact, power,  normal throughout.no focal deficits noted.   Assessment & Plan  Type 2 diabetes mellitus with vascular  disease (HCC) Controlled, no change in medication Felicia Schneider is reminded of the importance of commitment to daily physical activity for 30 minutes or more, as able and the need to limit carbohydrate intake to 30 to 60 grams per meal to help with blood sugar control.   The need to take medication as prescribed, test blood sugar as directed, and to call between visits if there is a concern that blood sugar is uncontrolled is also discussed.   Felicia Schneider is reminded of the importance of daily foot exam, annual eye examination, and good blood sugar, blood pressure and cholesterol control.     Latest Ref Rng & Units 06/30/2022   11:47 AM 02/27/2022   11:50 AM 12/28/2021   12:29 PM 08/11/2021    9:19 AM 05/12/2021    4:18 PM  Diabetic Labs  HbA1c 4.8 - 5.6 % 6.2  6.1   7.6  7.1   Micro/Creat Ratio 0 - 29 mg/g creat  31      Chol 100 - 199 mg/dL  409   811    HDL >91 mg/dL  58   47    Calc LDL 0 - 99 mg/dL  57   75    Triglycerides 0 - 149 mg/dL  85   478    Creatinine 0.57 - 1.00 mg/dL 2.95  6.21  3.08  6.57  1.20       07/14/2022    3:01 PM 07/14/2022    2:32 PM 07/14/2022  2:31 PM 06/30/2022    2:27 PM 06/30/2022    2:26 PM 03/02/2022    3:11 PM 03/02/2022    3:09 PM  BP/Weight  Systolic BP 132 131 148 144 152 132 141  Diastolic BP 82 81 78 79 78 80 88  Wt. (Lbs)   190  188.6  188  BMI   27.26 kg/m2  27.06 kg/m2  26.98 kg/m2      12/30/2021    3:40 PM 11/03/2020   10:20 AM  Foot/eye exam completion dates  Foot Form Completion Done Done      Resume ozempic when available and reduce actoplusmet dose accordingly, my chart messaging to be utilized between visits  Pain and swelling of left ankle 70 % improved reportedly, check uric acid level Short course of prednisone to be added  Essential hypertension Controlled, no change in medication DASH diet and commitment to daily physical activity for a minimum of 30 minutes discussed and encouraged, as a part of hypertension  management. The importance of attaining a healthy weight is also discussed.     07/14/2022    3:01 PM 07/14/2022    2:32 PM 07/14/2022    2:31 PM 06/30/2022    2:27 PM 06/30/2022    2:26 PM 03/02/2022    3:11 PM 03/02/2022    3:09 PM  BP/Weight  Systolic BP 132 131 148 144 152 132 141  Diastolic BP 82 81 78 79 78 80 88  Wt. (Lbs)   190  188.6  188  BMI   27.26 kg/m2  27.06 kg/m2  26.98 kg/m2       Dyslipidemia, goal LDL below 100 Controlled, no change in medication Hyperlipidemia:Low fat diet discussed and encouraged. Updated lab needed at/ before next visit.    Lipid Panel  Lab Results  Component Value Date   CHOL 131 02/27/2022   HDL 58 02/27/2022   LDLCALC 57 02/27/2022   TRIG 85 02/27/2022   CHOLHDL 2.3 02/27/2022       Overweight (BMI 25.0-29.9)  Patient re-educated about  the importance of commitment to a  minimum of 150 minutes of exercise per week as able.  The importance of healthy food choices with portion control discussed, as well as eating regularly and within a 12 hour window most days. The need to choose "clean , green" food 50 to 75% of the time is discussed, as well as to make water the primary drink and set a goal of 64 ounces water daily.       07/14/2022    2:31 PM 06/30/2022    2:26 PM 03/02/2022    3:09 PM  Weight /BMI  Weight 190 lb 188 lb 9.6 oz 188 lb  Height 5\' 10"  (1.778 m) 5\' 10"  (1.778 m) 5\' 10"  (1.778 m)  BMI 27.26 kg/m2 27.06 kg/m2 26.98 kg/m2

## 2022-07-16 NOTE — Assessment & Plan Note (Signed)
Controlled, no change in medication DASH diet and commitment to daily physical activity for a minimum of 30 minutes discussed and encouraged, as a part of hypertension management. The importance of attaining a healthy weight is also discussed.     07/14/2022    3:01 PM 07/14/2022    2:32 PM 07/14/2022    2:31 PM 06/30/2022    2:27 PM 06/30/2022    2:26 PM 03/02/2022    3:11 PM 03/02/2022    3:09 PM  BP/Weight  Systolic BP 132 131 148 144 152 132 141  Diastolic BP 82 81 78 79 78 80 88  Wt. (Lbs)   190  188.6  188  BMI   27.26 kg/m2  27.06 kg/m2  26.98 kg/m2

## 2022-07-16 NOTE — Assessment & Plan Note (Signed)
Controlled, no change in medication Felicia Schneider is reminded of the importance of commitment to daily physical activity for 30 minutes or more, as able and the need to limit carbohydrate intake to 30 to 60 grams per meal to help with blood sugar control.   The need to take medication as prescribed, test blood sugar as directed, and to call between visits if there is a concern that blood sugar is uncontrolled is also discussed.   Felicia Schneider is reminded of the importance of daily foot exam, annual eye examination, and good blood sugar, blood pressure and cholesterol control.     Latest Ref Rng & Units 06/30/2022   11:47 AM 02/27/2022   11:50 AM 12/28/2021   12:29 PM 08/11/2021    9:19 AM 05/12/2021    4:18 PM  Diabetic Labs  HbA1c 4.8 - 5.6 % 6.2  6.1   7.6  7.1   Micro/Creat Ratio 0 - 29 mg/g creat  31      Chol 100 - 199 mg/dL  308   657    HDL >84 mg/dL  58   47    Calc LDL 0 - 99 mg/dL  57   75    Triglycerides 0 - 149 mg/dL  85   696    Creatinine 0.57 - 1.00 mg/dL 2.95  2.84  1.32  4.40  1.20       07/14/2022    3:01 PM 07/14/2022    2:32 PM 07/14/2022    2:31 PM 06/30/2022    2:27 PM 06/30/2022    2:26 PM 03/02/2022    3:11 PM 03/02/2022    3:09 PM  BP/Weight  Systolic BP 132 131 148 144 152 132 141  Diastolic BP 82 81 78 79 78 80 88  Wt. (Lbs)   190  188.6  188  BMI   27.26 kg/m2  27.06 kg/m2  26.98 kg/m2      12/30/2021    3:40 PM 11/03/2020   10:20 AM  Foot/eye exam completion dates  Foot Form Completion Done Done      Resume ozempic when available and reduce actoplusmet dose accordingly, my chart messaging to be utilized between visits

## 2022-07-16 NOTE — Assessment & Plan Note (Signed)
  Patient re-educated about  the importance of commitment to a  minimum of 150 minutes of exercise per week as able.  The importance of healthy food choices with portion control discussed, as well as eating regularly and within a 12 hour window most days. The need to choose "clean , green" food 50 to 75% of the time is discussed, as well as to make water the primary drink and set a goal of 64 ounces water daily.       07/14/2022    2:31 PM 06/30/2022    2:26 PM 03/02/2022    3:09 PM  Weight /BMI  Weight 190 lb 188 lb 9.6 oz 188 lb  Height 5\' 10"  (1.778 m) 5\' 10"  (1.778 m) 5\' 10"  (1.778 m)  BMI 27.26 kg/m2 27.06 kg/m2 26.98 kg/m2

## 2022-09-15 ENCOUNTER — Other Ambulatory Visit: Payer: Self-pay | Admitting: Family Medicine

## 2022-10-16 ENCOUNTER — Other Ambulatory Visit (HOSPITAL_COMMUNITY): Payer: Self-pay | Admitting: Family Medicine

## 2022-10-16 DIAGNOSIS — Z1231 Encounter for screening mammogram for malignant neoplasm of breast: Secondary | ICD-10-CM

## 2022-12-29 ENCOUNTER — Other Ambulatory Visit: Payer: Self-pay | Admitting: Family Medicine

## 2023-03-25 ENCOUNTER — Other Ambulatory Visit: Payer: Self-pay | Admitting: Family Medicine

## 2023-04-21 ENCOUNTER — Other Ambulatory Visit: Payer: Self-pay | Admitting: Family Medicine

## 2023-05-04 ENCOUNTER — Ambulatory Visit: Payer: 59 | Admitting: Family Medicine

## 2023-05-04 ENCOUNTER — Ambulatory Visit (HOSPITAL_COMMUNITY): Payer: 59

## 2023-05-11 ENCOUNTER — Ambulatory Visit (HOSPITAL_COMMUNITY)
Admission: RE | Admit: 2023-05-11 | Discharge: 2023-05-11 | Disposition: A | Discharge status: Home / Self Care | Payer: 59 | Source: Ambulatory Visit | Attending: Family Medicine | Admitting: Family Medicine

## 2023-05-11 ENCOUNTER — Encounter (HOSPITAL_COMMUNITY): Payer: Self-pay

## 2023-05-11 DIAGNOSIS — Z1231 Encounter for screening mammogram for malignant neoplasm of breast: Secondary | ICD-10-CM | POA: Diagnosis present

## 2023-05-15 ENCOUNTER — Encounter: Payer: Self-pay | Admitting: Family Medicine

## 2023-05-17 ENCOUNTER — Other Ambulatory Visit: Payer: Self-pay

## 2023-05-17 MED ORDER — SEMAGLUTIDE(0.25 OR 0.5MG/DOS) 2 MG/3ML ~~LOC~~ SOPN: PEN_INJECTOR | SUBCUTANEOUS | 2 refills | Status: DC

## 2023-06-01 ENCOUNTER — Encounter: Payer: Self-pay | Admitting: Family Medicine

## 2023-06-05 ENCOUNTER — Other Ambulatory Visit: Payer: Self-pay | Admitting: Family Medicine

## 2023-06-05 DIAGNOSIS — E1159 Type 2 diabetes mellitus with other circulatory complications: Secondary | ICD-10-CM

## 2023-06-05 DIAGNOSIS — E785 Hyperlipidemia, unspecified: Secondary | ICD-10-CM

## 2023-06-05 DIAGNOSIS — I10 Essential (primary) hypertension: Secondary | ICD-10-CM

## 2023-06-10 ENCOUNTER — Other Ambulatory Visit: Payer: Self-pay | Admitting: Family Medicine

## 2023-06-10 LAB — CBC WITH DIFFERENTIAL/PLATELET
Basophils Absolute: 0 10*3/uL (ref 0.0–0.2)
Basos: 0 %
EOS (ABSOLUTE): 0.1 10*3/uL (ref 0.0–0.4)
Eos: 1 %
Hematocrit: 38.8 % (ref 34.0–46.6)
Hemoglobin: 12.5 g/dL (ref 11.1–15.9)
Immature Grans (Abs): 0 10*3/uL (ref 0.0–0.1)
Immature Granulocytes: 0 %
Lymphocytes Absolute: 2.2 10*3/uL (ref 0.7–3.1)
Lymphs: 33 %
MCH: 29.5 pg (ref 26.6–33.0)
MCHC: 32.2 g/dL (ref 31.5–35.7)
MCV: 92 fL (ref 79–97)
Monocytes Absolute: 0.5 10*3/uL (ref 0.1–0.9)
Monocytes: 8 %
Neutrophils Absolute: 3.8 10*3/uL (ref 1.4–7.0)
Neutrophils: 58 %
Platelets: 383 10*3/uL (ref 150–450)
RBC: 4.24 x10E6/uL (ref 3.77–5.28)
RDW: 14.3 % (ref 11.7–15.4)
WBC: 6.7 10*3/uL (ref 3.4–10.8)

## 2023-06-10 LAB — CMP14+EGFR
ALT: 17 IU/L (ref 0–32)
AST: 20 IU/L (ref 0–40)
Albumin: 4.4 g/dL (ref 3.8–4.9)
Alkaline Phosphatase: 75 IU/L (ref 44–121)
BUN/Creatinine Ratio: 23 (ref 9–23)
BUN: 23 mg/dL (ref 6–24)
Bilirubin Total: 0.8 mg/dL (ref 0.0–1.2)
CO2: 24 mmol/L (ref 20–29)
Calcium: 10 mg/dL (ref 8.7–10.2)
Chloride: 101 mmol/L (ref 96–106)
Creatinine, Ser: 1 mg/dL (ref 0.57–1.00)
Globulin, Total: 3.3 g/dL (ref 1.5–4.5)
Glucose: 85 mg/dL (ref 70–99)
Potassium: 4.6 mmol/L (ref 3.5–5.2)
Sodium: 142 mmol/L (ref 134–144)
Total Protein: 7.7 g/dL (ref 6.0–8.5)
eGFR: 65 mL/min/{1.73_m2} (ref >59)

## 2023-06-10 LAB — LIPID PANEL
Chol/HDL Ratio: 2.5 ratio (ref 0.0–4.4)
Cholesterol, Total: 151 mg/dL (ref 100–199)
HDL: 60 mg/dL (ref >39)
LDL Chol Calc (NIH): 74 mg/dL (ref 0–99)
Triglycerides: 94 mg/dL (ref 0–149)
VLDL Cholesterol Cal: 17 mg/dL (ref 5–40)

## 2023-06-10 LAB — HEMOGLOBIN A1C
Est. average glucose Bld gHb Est-mCnc: 128 mg/dL
Hgb A1c MFr Bld: 6.1 % — ABNORMAL HIGH (ref 4.8–5.6)

## 2023-06-10 LAB — MICROALBUMIN / CREATININE URINE RATIO
Creatinine, Urine: 133 mg/dL
Microalb/Creat Ratio: 100 mg/g{creat} — ABNORMAL HIGH (ref 0–29)
Microalbumin, Urine: 133.1 ug/mL

## 2023-06-10 LAB — TSH: TSH: 1.01 u[IU]/mL (ref 0.450–4.500)

## 2023-06-15 ENCOUNTER — Ambulatory Visit: Payer: 59 | Admitting: Family Medicine

## 2023-06-15 VITALS — BP 144/82 | HR 76 | Resp 18 | Ht 69.0 in | Wt 195.0 lb

## 2023-06-15 DIAGNOSIS — E785 Hyperlipidemia, unspecified: Secondary | ICD-10-CM | POA: Diagnosis not present

## 2023-06-15 DIAGNOSIS — E1169 Type 2 diabetes mellitus with other specified complication: Secondary | ICD-10-CM | POA: Diagnosis not present

## 2023-06-15 DIAGNOSIS — I10 Essential (primary) hypertension: Secondary | ICD-10-CM | POA: Diagnosis not present

## 2023-06-15 DIAGNOSIS — E663 Overweight: Secondary | ICD-10-CM | POA: Diagnosis not present

## 2023-06-15 MED ORDER — SCOPOLAMINE 1 MG/3DAYS TD PT72: 1.0000 | MEDICATED_PATCH | TRANSDERMAL | 0 refills | Status: DC

## 2023-06-15 NOTE — Patient Instructions (Signed)
 Follow-up in 7 weeks, re eval;uate blood pressure, call if you need me sooner.    Blood pressure is above goal and it is likely that you will need a higher dose of medication to get to a goal of consistently having a blood pressure less than 130/80   Blood sugar and cholesterol kidney and liver function are all normal which is excellent.  Please eat clean and green 90% of the time and drink 64 ounces of water daily.  Please try to commit to regular physical activity for help 30 minutes at least 5 days/week.  Condolence  and Prayers  at this difficult time continue.for you and your family  Celebrate and enjoy family and love safe trip.  Medication for motion sickness transscopolamine has been sent to your pharmacy.  Thanks for choosing [facility], we consider it a privelige to serve you.

## 2023-06-16 ENCOUNTER — Encounter: Payer: Self-pay | Admitting: Family Medicine

## 2023-06-16 DIAGNOSIS — E1169 Type 2 diabetes mellitus with other specified complication: Secondary | ICD-10-CM | POA: Insufficient documentation

## 2023-06-16 NOTE — Assessment & Plan Note (Signed)
 Not at goal and will likley need additional med , will re eval in 7 weeks DASH diet and commitment to daily physical activity for a minimum of 30 minutes discussed and encouraged, as a part of hypertension management. The importance of attaining a healthy weight is also discussed.     06/15/2023    9:57 AM 06/15/2023    8:55 AM 07/14/2022    3:01 PM 07/14/2022    2:32 PM 07/14/2022    2:31 PM 06/30/2022    2:27 PM 06/30/2022    2:26 PM  BP/Weight  Systolic BP 144 153 132 131 148 144 152  Diastolic BP 82 88 82 81 78 79 78  Wt. (Lbs)  195   190  188.6  BMI  28.8 kg/m2   27.26 kg/m2  27.06 kg/m2     '

## 2023-06-16 NOTE — Progress Notes (Signed)
 Felicia Schneider     MRN: [id number]      DOB: [birth year]  Chief Complaint  Patient presents with   Diabetes    Follow up     HPI Felicia Schneider is here for follow up and re-evaluation of chronic medical conditions, medication management and review of any available recent lab and radiology data.  Preventive health is updated, specifically  Cancer screening and Immunization.   Questions or concerns regarding consultations or procedures which the PT has had in the interim are  addressed. The PT denies any adverse reactions to current medications since the last visit.  Grieving the recnt loss of her mother who battled rectal cancer for 5 years Has been eating on the run, not eating and not exercising as consistently as he had planned Has upcoming birhday celebration traveland requesrs medication for potential motion sickness ROS Denies recent fever or chills. Denies sinus pressure, nasal congestion, ear pain or sore throat. Denies chest congestion, productive cough or wheezing. Denies chest pains, palpitations and leg swelling Denies abdominal pain, nausea, vomiting,diarrhea or constipation.   Denies dysuria, frequency, hesitancy or incontinence. Denies joint pain, swelling and limitation in mobility. Denies headaches, seizures, numbness, or tingling. Denies skin break down or rash.   PE  BP (!) 144/82   Pulse 76   Resp 18   Ht 5\' 9"  (1.753 m)   Wt 195 lb (88.5 kg)   LMP 07/29/2014 (Exact Date)   SpO2 98%   BMI 28.80 kg/m   Patient alert and oriented and in no cardiopulmonary distress.  HEENT: No facial asymmetry, EOMI,     Neck supple .  Chest: Clear to auscultation bilaterally.  CVS: S1, S2 no murmurs, no S3.Regular rate.  ABD: Soft non tender.   Ext: No edema  MS: Adequate ROM spine, shoulders, hips and knees.  Skin: Intact, no ulcerations or rash noted.  Psych: Good eye contact, normal affect. Memory intact not anxious or depressed appearing.  CNS: CN 2-12  intact, power,  normal throughout.no focal deficits noted.   Assessment & Plan  Essential hypertension Not at goal and will likley need additional med , will re eval in 7 weeks DASH diet and commitment to daily physical activity for a minimum of 30 minutes discussed and encouraged, as a part of hypertension management. The importance of attaining a healthy weight is also discussed.     06/15/2023    9:57 AM 06/15/2023    8:55 AM 07/14/2022    3:01 PM 07/14/2022    2:32 PM 07/14/2022    2:31 PM 06/30/2022    2:27 PM 06/30/2022    2:26 PM  BP/Weight  Systolic BP 144 153 132 131 148 144 152  Diastolic BP 82 88 82 81 78 79 78  Wt. (Lbs)  195   190  188.6  BMI  28.8 kg/m2   27.26 kg/m2  27.06 kg/m2     '  Dyslipidemia, goal LDL below 100 Hyperlipidemia:Low fat diet discussed and encouraged.   Lipid Panel  Lab Results  Component Value Date   CHOL 151 06/08/2023   HDL 60 06/08/2023   LDLCALC 74 06/08/2023   TRIG 94 06/08/2023   CHOLHDL 2.5 06/08/2023     Controlled, no change in medication   Overweight (BMI 25.0-29.9)  Patient re-educated about  the importance of commitment to a  minimum of 150 minutes of exercise per week as able.  The importance of healthy food choices with portion control discussed, as well as  eating regularly and within a 12 hour window most days. The need to choose "clean , green" food 50 to 75% of the time is discussed, as well as to make water the primary drink and set a goal of 64 ounces water daily.       06/15/2023    8:55 AM 07/14/2022    2:31 PM 06/30/2022    2:26 PM  Weight /BMI  Weight 195 lb 190 lb 188 lb 9.6 oz  Height 5\' 9"  (1.753 m) 5\' 10"  (1.778 m) 5\' 10"  (1.778 m)  BMI 28.8 kg/m2 27.26 kg/m2 27.06 kg/m2      Type 2 diabetes mellitus with other specified complication (HCC) Diabetes associated with hypertension and hyperlipidemia  Felicia Schneider is reminded of the importance of commitment to daily physical activity for 30 minutes or more,  as able and the need to limit carbohydrate intake to 30 to 60 grams per meal to help with blood sugar control.  Controlled, no change in medication   The need to take medication as prescribed, test blood sugar as directed, and to call between visits if there is a concern that blood sugar is uncontrolled is also discussed.   Felicia Schneider is reminded of the importance of daily foot exam, annual eye examination, and good blood sugar, blood pressure and cholesterol control.     Latest Ref Rng & Units 06/08/2023   12:03 PM 06/30/2022   11:47 AM 02/27/2022   11:50 AM 12/28/2021   12:29 PM 08/11/2021    9:19 AM  Diabetic Labs  HbA1c 4.8 - 5.6 % 6.1  6.2  6.1   7.6   Micro/Creat Ratio 0 - 29 mg/g creat 100   31     Chol 100 - 199 mg/dL 010   272   536   HDL >64 mg/dL 60   58   47   Calc LDL 0 - 99 mg/dL 74   57   75   Triglycerides 0 - 149 mg/dL 94   85   403   Creatinine 0.57 - 1.00 mg/dL 4.74  2.59  5.63  8.75  1.29       06/15/2023    9:57 AM 06/15/2023    8:55 AM 07/14/2022    3:01 PM 07/14/2022    2:32 PM 07/14/2022    2:31 PM 06/30/2022    2:27 PM 06/30/2022    2:26 PM  BP/Weight  Systolic BP 144 153 132 131 148 144 152  Diastolic BP 82 88 82 81 78 79 78  Wt. (Lbs)  195   190  188.6  BMI  28.8 kg/m2   27.26 kg/m2  27.06 kg/m2      06/15/2023    9:00 AM 12/30/2021    3:40 PM  Foot/eye exam completion dates  Foot Form Completion Done Done

## 2023-06-16 NOTE — Assessment & Plan Note (Signed)
  Patient re-educated about  the importance of commitment to a  minimum of 150 minutes of exercise per week as able.  The importance of healthy food choices with portion control discussed, as well as eating regularly and within a 12 hour window most days. The need to choose "clean , green" food 50 to 75% of the time is discussed, as well as to make water the primary drink and set a goal of 64 ounces water daily.       06/15/2023    8:55 AM 07/14/2022    2:31 PM 06/30/2022    2:26 PM  Weight /BMI  Weight 195 lb 190 lb 188 lb 9.6 oz  Height 5\' 9"  (1.753 m) 5\' 10"  (1.778 m) 5\' 10"  (1.778 m)  BMI 28.8 kg/m2 27.26 kg/m2 27.06 kg/m2

## 2023-06-16 NOTE — Assessment & Plan Note (Signed)
 Diabetes associated with hypertension and hyperlipidemia  Felicia Schneider is reminded of the importance of commitment to daily physical activity for 30 minutes or more, as able and the need to limit carbohydrate intake to 30 to 60 grams per meal to help with blood sugar control.  Controlled, no change in medication   The need to take medication as prescribed, test blood sugar as directed, and to call between visits if there is a concern that blood sugar is uncontrolled is also discussed.   Felicia Schneider is reminded of the importance of daily foot exam, annual eye examination, and good blood sugar, blood pressure and cholesterol control.     Latest Ref Rng & Units 06/08/2023   12:03 PM 06/30/2022   11:47 AM 02/27/2022   11:50 AM 12/28/2021   12:29 PM 08/11/2021    9:19 AM  Diabetic Labs  HbA1c 4.8 - 5.6 % 6.1  6.2  6.1   7.6   Micro/Creat Ratio 0 - 29 mg/g creat 100   31     Chol 100 - 199 mg/dL 811   914   782   HDL >95 mg/dL 60   58   47   Calc LDL 0 - 99 mg/dL 74   57   75   Triglycerides 0 - 149 mg/dL 94   85   621   Creatinine 0.57 - 1.00 mg/dL 3.08  6.57  8.46  9.62  1.29       06/15/2023    9:57 AM 06/15/2023    8:55 AM 07/14/2022    3:01 PM 07/14/2022    2:32 PM 07/14/2022    2:31 PM 06/30/2022    2:27 PM 06/30/2022    2:26 PM  BP/Weight  Systolic BP 144 153 132 131 148 144 152  Diastolic BP 82 88 82 81 78 79 78  Wt. (Lbs)  195   190  188.6  BMI  28.8 kg/m2   27.26 kg/m2  27.06 kg/m2      06/15/2023    9:00 AM 12/30/2021    3:40 PM  Foot/eye exam completion dates  Foot Form Completion Done Done

## 2023-06-16 NOTE — Assessment & Plan Note (Signed)
 Hyperlipidemia:Low fat diet discussed and encouraged.   Lipid Panel  Lab Results  Component Value Date   CHOL 151 06/08/2023   HDL 60 06/08/2023   LDLCALC 74 06/08/2023   TRIG 94 06/08/2023   CHOLHDL 2.5 06/08/2023     Controlled, no change in medication

## 2023-07-15 IMAGING — CT CT CARDIAC CORONARY ARTERY CALCIUM SCORE
3 series · 14 of 20 positions shown, 16 images · non-contrast
Comparison: None Available.
COMPARISON: None Available.

Addendum:
EXAM:
OVER-READ INTERPRETATION  CT CHEST

The following report is a limited chest CT over-read performed by
08/12/2021. The coronary calcium score interpretation by the
cardiologist is attached.
CLINICAL DATA: Risk stratification
Coronary Calcium Score
TECHNIQUE: The patient was scanned on a Siemens Somatom 64 slice scanner. Axial
non-contrast 3 mm slices were carried out through the heart. The
data set was analyzed on a dedicated work station and scored using
the Agatson method.

[Series 2: cascseq 2.0 sa36 70% (id) · axial · 0.39mm/px · z∈[-201,-111]mm · 4 of 76 slices shown]
[im 16/76  vessel]
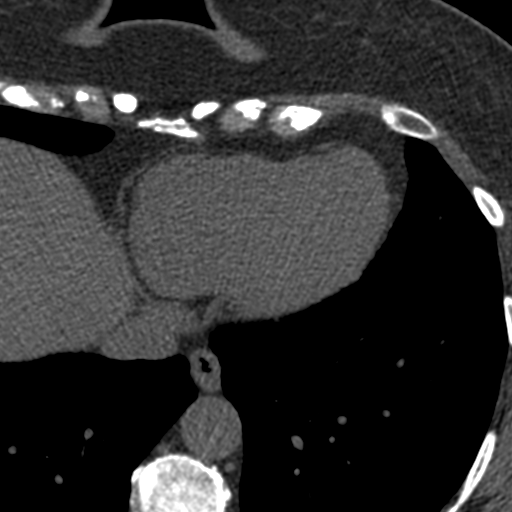
[im 31/76  vessel]
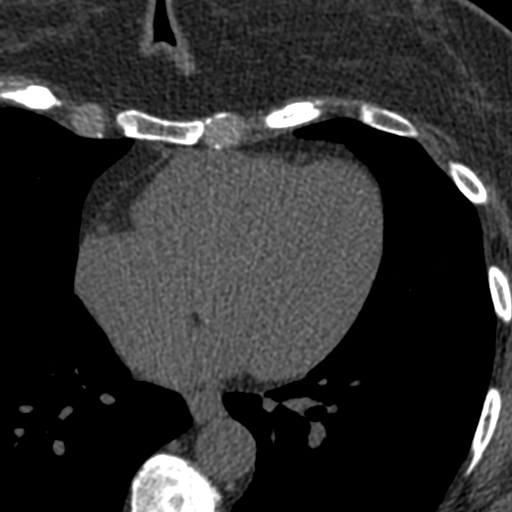
[im 46/76  vessel]
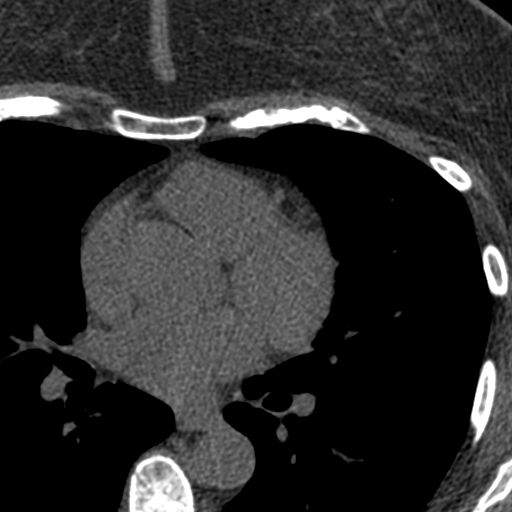
[im 61/76  vessel]
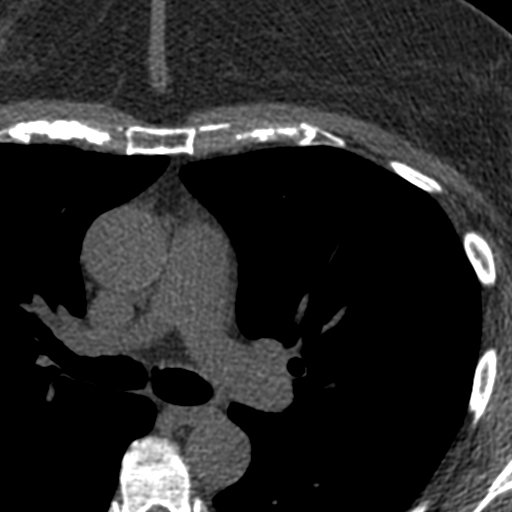

[Series 3: cascseq 2.0 bf37 st · axial · 0.67mm/px · z∈[-207,-107]mm · 5 of 76 slices shown, 7 images]
[im 13/76  vessel]
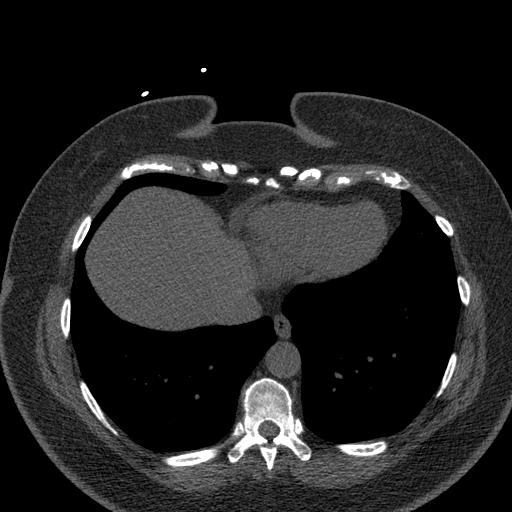
[im 13/76  lung]
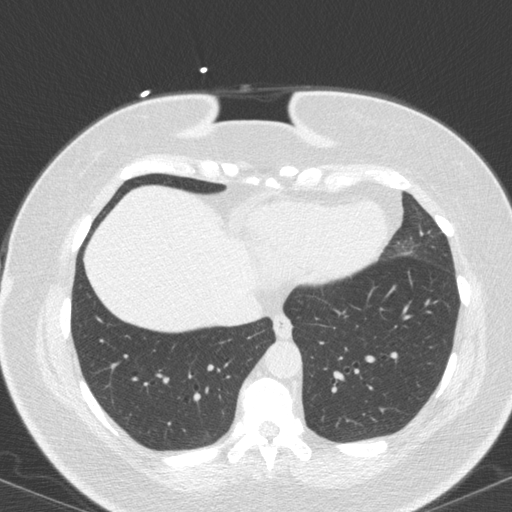
[im 26/76  vessel]
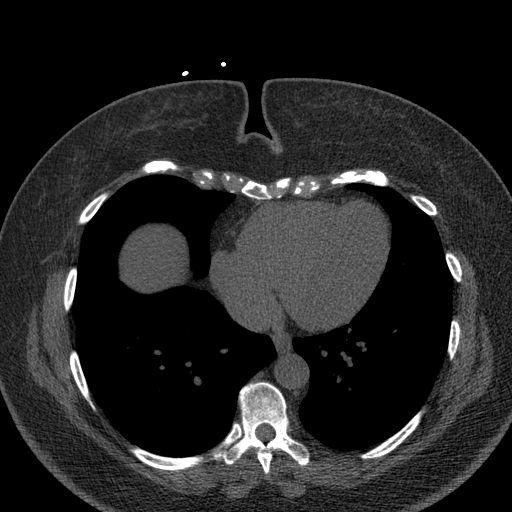
[im 38/76  vessel]
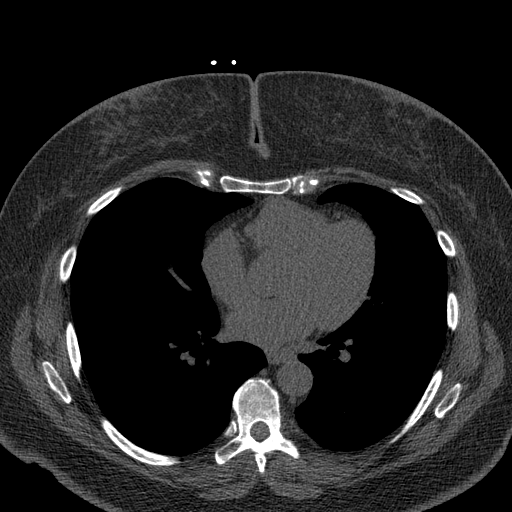
[im 51/76  vessel]
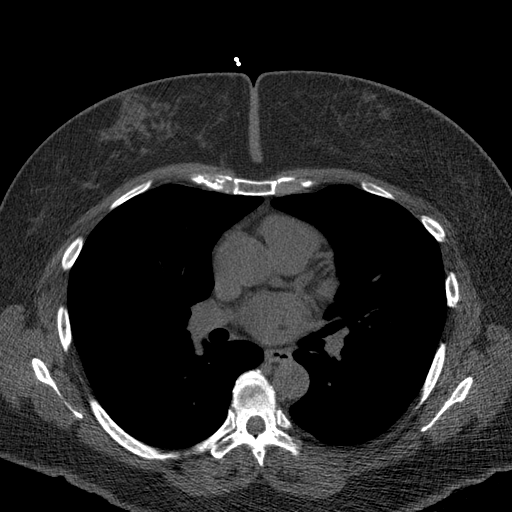
[im 63/76  vessel]
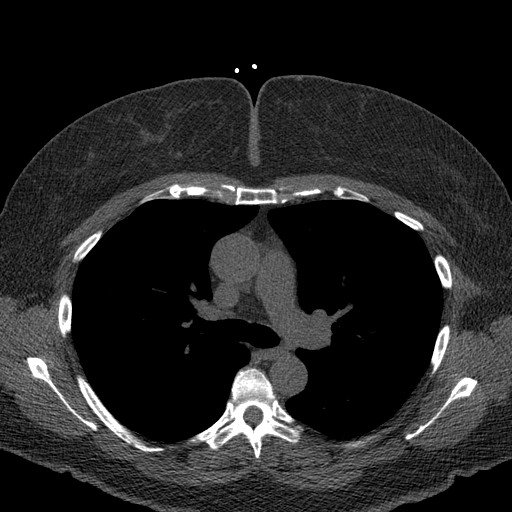
[im 63/76  lung]
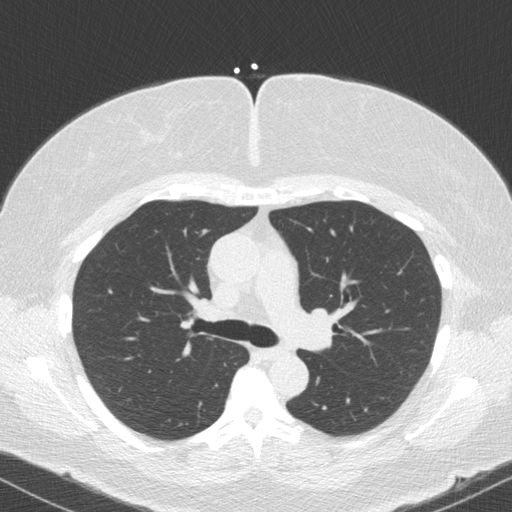

[Series 4: cascseq 2.0 br59 lung · axial · 0.67mm/px · z∈[-207,-107]mm · 5 of 76 slices shown]
[im 13/76  lung]
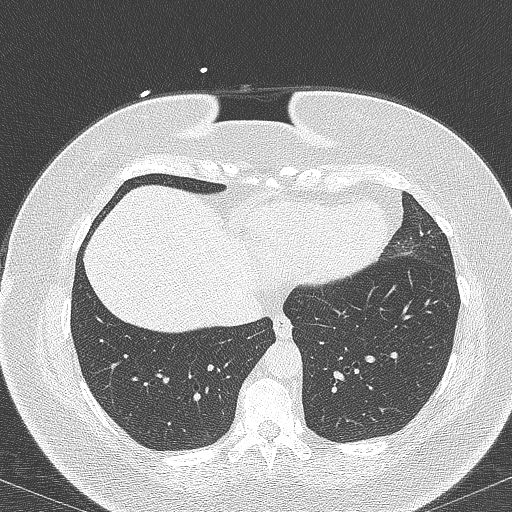
[im 26/76  lung]
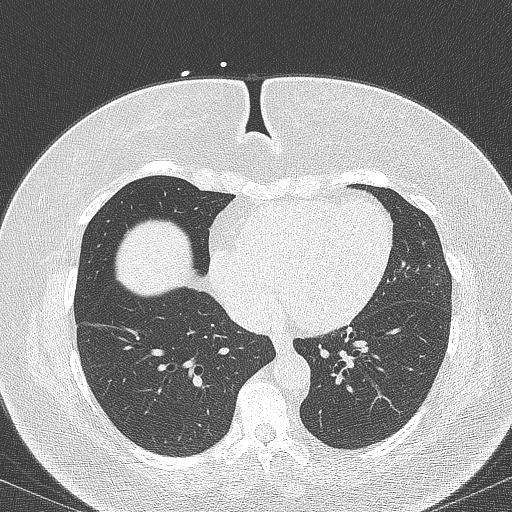
[im 38/76  lung]
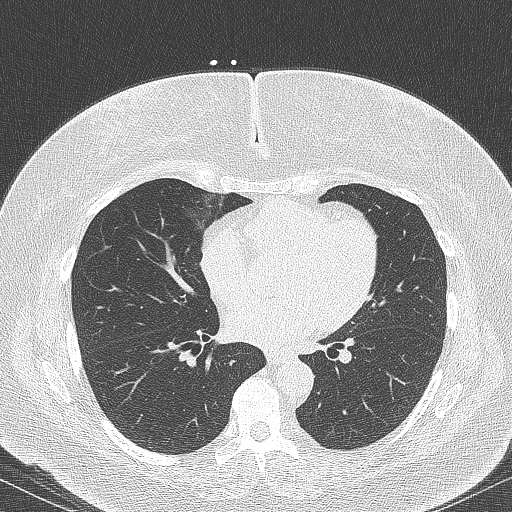
[im 51/76  lung]
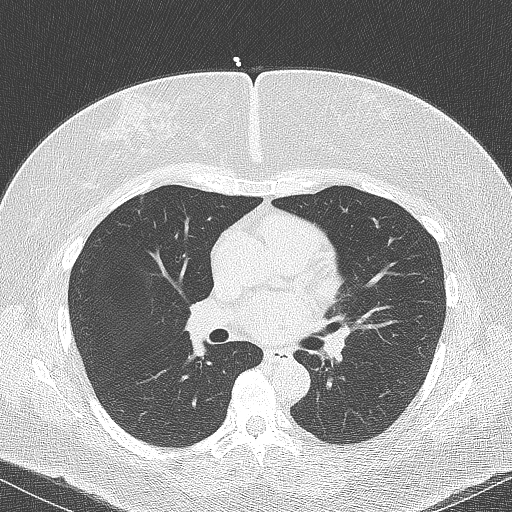
[im 63/76  lung]
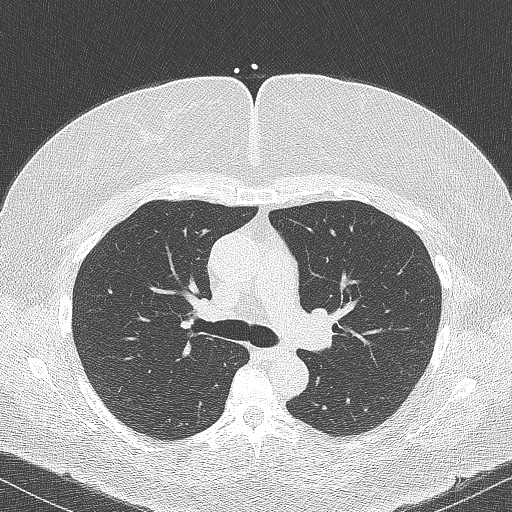

[14 of 20 positions shown; findings below may reference images not displayed]

FINDINGS: Within the visualized portions of the thorax there are no suspicious
appearing pulmonary nodules or masses, there is no acute
consolidative airspace disease, no pleural effusions, no
pneumothorax and no lymphadenopathy. Visualized portions of the
upper abdomen are unremarkable. There are no aggressive appearing
lytic or blastic lesions noted in the visualized portions of the
skeleton.
IMPRESSION: 1. No significant incidental noncardiac findings are noted.
FINDINGS: Non-cardiac: See separate report from [REDACTED].

Ascending aorta: Normal diameter 3.4 cm

Pericardium: Normal

Coronary arteries: No calcium noted
IMPRESSION: Coronary calcium score of 0.

Iesha Yeah

*** End of Addendum ***
EXAM:
OVER-READ INTERPRETATION  CT CHEST

The following report is a limited chest CT over-read performed by
08/12/2021. The coronary calcium score interpretation by the
cardiologist is attached.
FINDINGS: Within the visualized portions of the thorax there are no suspicious
appearing pulmonary nodules or masses, there is no acute
consolidative airspace disease, no pleural effusions, no
pneumothorax and no lymphadenopathy. Visualized portions of the
upper abdomen are unremarkable. There are no aggressive appearing
lytic or blastic lesions noted in the visualized portions of the
skeleton.
IMPRESSION: 1. No significant incidental noncardiac findings are noted.

## 2023-08-17 ENCOUNTER — Ambulatory Visit: Payer: Self-pay | Admitting: Family Medicine

## 2023-09-05 ENCOUNTER — Other Ambulatory Visit: Payer: Self-pay | Admitting: Family Medicine

## 2023-09-21 ENCOUNTER — Ambulatory Visit (INDEPENDENT_AMBULATORY_CARE_PROVIDER_SITE_OTHER): Admitting: Family Medicine

## 2023-09-21 ENCOUNTER — Encounter: Payer: Self-pay | Admitting: Family Medicine

## 2023-09-21 VITALS — BP 156/81 | HR 75 | Resp 16 | Ht 70.0 in | Wt 202.0 lb

## 2023-09-21 DIAGNOSIS — E663 Overweight: Secondary | ICD-10-CM

## 2023-09-21 DIAGNOSIS — E1169 Type 2 diabetes mellitus with other specified complication: Secondary | ICD-10-CM | POA: Diagnosis not present

## 2023-09-21 DIAGNOSIS — I10 Essential (primary) hypertension: Secondary | ICD-10-CM | POA: Diagnosis not present

## 2023-09-21 MED ORDER — AMLODIPINE BESYLATE 5 MG PO TABS: ORAL_TABLET | ORAL | 1 refills | Status: AC

## 2023-09-21 NOTE — Patient Instructions (Addendum)
 F/U with  labs done 1 week before visit please, end August  HBA1C , bmp and EGFR 1 week before next appt [facility] [place]  New higher dose of amlodipine  5mg  TWO daily ( 10 mg one daily)  Pls start checking FBG, to ensure still good  Goal for fasting blood sugar ranges from 80 to 120 and 2 hours after any meal or at bedtime should be between 130 to 170.  It is important that you exercise regularly at least 30 minutes 5 times a week. If you develop chest pain, have severe difficulty breathing, or feel very tired, stop exercising immediately and seek medical attention    Thanks for choosing [facility], we consider it a privelige to serve you.

## 2023-09-22 ENCOUNTER — Encounter: Payer: Self-pay | Admitting: Family Medicine

## 2023-09-22 NOTE — Assessment & Plan Note (Signed)
 UNCONTROLLED , INC AMLODIPINE  TO 10 MG DAILY DASH diet and commitment to daily physical activity for a minimum of 30 minutes discussed and encouraged, as a part of hypertension management. The importance of attaining a healthy weight is also discussed.     09/21/2023   11:19 AM 06/15/2023    9:57 AM 06/15/2023    8:55 AM 07/14/2022    3:01 PM 07/14/2022    2:32 PM 07/14/2022    2:31 PM 06/30/2022    2:27 PM  BP/Weight  Systolic BP 156 144 153 132 131 148 144  Diastolic BP 81 82 88 82 81 78 79  Wt. (Lbs) 202  195   190   BMI 28.98 kg/m2  28.8 kg/m2   27.26 kg/m2

## 2023-09-22 NOTE — Assessment & Plan Note (Signed)
 Diabetes associated with hypertension and hyperlipidemia  Felicia Schneider is reminded of the importance of commitment to daily physical activity for 30 minutes or more, as able and the need to limit carbohydrate intake to 30 to 60 grams per meal to help with blood sugar control.   The need to take medication as prescribed, test blood sugar as directed, and to call between visits if there is a concern that blood sugar is uncontrolled is also discussed.   Felicia Schneider is reminded of the importance of daily foot exam, annual eye examination, and good blood sugar, blood pressure and cholesterol control.     Latest Ref Rng & Units 06/08/2023   12:03 PM 06/30/2022   11:47 AM 02/27/2022   11:50 AM 12/28/2021   12:29 PM 08/11/2021    9:19 AM  Diabetic Labs  HbA1c 4.8 - 5.6 % 6.1  6.2  6.1   7.6   Micro/Creat Ratio 0 - 29 mg/g creat 100   31     Chol 100 - 199 mg/dL 848   868   851   HDL >60 mg/dL 60   58   47   Calc LDL 0 - 99 mg/dL 74   57   75   Triglycerides 0 - 149 mg/dL 94   85   850   Creatinine 0.57 - 1.00 mg/dL 8.99  9.02  8.88  8.71  1.29       09/21/2023   11:19 AM 06/15/2023    9:57 AM 06/15/2023    8:55 AM 07/14/2022    3:01 PM 07/14/2022    2:32 PM 07/14/2022    2:31 PM 06/30/2022    2:27 PM  BP/Weight  Systolic BP 156 144 153 132 131 148 144  Diastolic BP 81 82 88 82 81 78 79  Wt. (Lbs) 202  195   190   BMI 28.98 kg/m2  28.8 kg/m2   27.26 kg/m2       06/15/2023    9:00 AM 12/30/2021    3:40 PM  Foot/eye exam completion dates  Foot Form Completion Done Done      Updated lab needed at/ before next visit.

## 2023-09-22 NOTE — Progress Notes (Signed)
 Felicia Schneider     MRN: [id number]      DOB: March 26, 1965  Chief Complaint  Patient presents with   Hypertension    7 week follow up     HPI Felicia Schneider is here for follow up and re-evaluation of chronic medical conditions, medication management and review of any available recent lab and radiology data.  Preventive health is updated, specifically  Cancer screening and Immunization.   Questions or concerns regarding consultations or procedures which the PT has had in the interim are  addressed. The PT denies any adverse reactions to current medications since the last visit.  There are no new concerns.  There are no specific complaints   ROS Denies recent fever or chills. Denies sinus pressure, nasal congestion, ear pain or sore throat. Denies chest congestion, productive cough or wheezing. Denies chest pains, palpitations and leg swelling Denies abdominal pain, nausea, vomiting,diarrhea or constipation.   Denies dysuria, frequency, hesitancy or incontinence. Denies joint pain, swelling and limitation in mobility. Denies headaches, seizures, numbness, or tingling. Denies depression, anxiety or insomnia. Denies skin break down or rash.   PE  BP (!) 156/81   Pulse 75   Resp 16   Ht 5' 10 (1.778 m)   Wt 202 lb (91.6 kg)   LMP 07/29/2014 (Exact Date)   SpO2 97%   BMI 28.98 kg/m   Patient alert and oriented and in no cardiopulmonary distress.  HEENT: No facial asymmetry, EOMI,     Neck supple .  Chest: Clear to auscultation bilaterally.  CVS: S1, S2 no murmurs, no S3.Regular rate.  ABD: Soft non tender.   Ext: No edema  MS: Adequate ROM spine, shoulders, hips and knees.  Skin: Intact, no ulcerations or rash noted.  Psych: Good eye contact, normal affect. Memory intact not anxious or depressed appearing.  CNS: CN 2-12 intact, power,  normal throughout.no focal deficits noted.   Assessment & Plan  Essential hypertension UNCONTROLLED , INC AMLODIPINE  TO 10 MG  DAILY DASH diet and commitment to daily physical activity for a minimum of 30 minutes discussed and encouraged, as a part of hypertension management. The importance of attaining a healthy weight is also discussed.     09/21/2023   11:19 AM 06/15/2023    9:57 AM 06/15/2023    8:55 AM 07/14/2022    3:01 PM 07/14/2022    2:32 PM 07/14/2022    2:31 PM 06/30/2022    2:27 PM  BP/Weight  Systolic BP 156 144 153 132 131 148 144  Diastolic BP 81 82 88 82 81 78 79  Wt. (Lbs) 202  195   190   BMI 28.98 kg/m2  28.8 kg/m2   27.26 kg/m2        Overweight (BMI 25.0-29.9) DETERIORATED  Patient re-educated about  the importance of commitment to a  minimum of 150 minutes of exercise per week as able.  The importance of healthy food choices with portion control discussed, as well as eating regularly and within a 12 hour window most days. The need to choose clean , green food 50 to 75% of the time is discussed, as well as to make water the primary drink and set a goal of 64 ounces water daily.       09/21/2023   11:19 AM 06/15/2023    8:55 AM 07/14/2022    2:31 PM  Weight /BMI  Weight 202 lb 195 lb 190 lb  Height 5' 10 (1.778 m) 5' 9 (1.753 m)  5' 10 (1.778 m)  BMI 28.98 kg/m2 28.8 kg/m2 27.26 kg/m2      Type 2 diabetes mellitus with other specified complication (HCC) Diabetes associated with hypertension and hyperlipidemia  Felicia Schneider is reminded of the importance of commitment to daily physical activity for 30 minutes or more, as able and the need to limit carbohydrate intake to 30 to 60 grams per meal to help with blood sugar control.   The need to take medication as prescribed, test blood sugar as directed, and to call between visits if there is a concern that blood sugar is uncontrolled is also discussed.   Felicia Schneider is reminded of the importance of daily foot exam, annual eye examination, and good blood sugar, blood pressure and cholesterol control.     Latest Ref Rng & Units 06/08/2023    12:03 PM 06/30/2022   11:47 AM 02/27/2022   11:50 AM 12/28/2021   12:29 PM 08/11/2021    9:19 AM  Diabetic Labs  HbA1c 4.8 - 5.6 % 6.1  6.2  6.1   7.6   Micro/Creat Ratio 0 - 29 mg/g creat 100   31     Chol 100 - 199 mg/dL 848   868   851   HDL >60 mg/dL 60   58   47   Calc LDL 0 - 99 mg/dL 74   57   75   Triglycerides 0 - 149 mg/dL 94   85   850   Creatinine 0.57 - 1.00 mg/dL 8.99  9.02  8.88  8.71  1.29       09/21/2023   11:19 AM 06/15/2023    9:57 AM 06/15/2023    8:55 AM 07/14/2022    3:01 PM 07/14/2022    2:32 PM 07/14/2022    2:31 PM 06/30/2022    2:27 PM  BP/Weight  Systolic BP 156 144 153 132 131 148 144  Diastolic BP 81 82 88 82 81 78 79  Wt. (Lbs) 202  195   190   BMI 28.98 kg/m2  28.8 kg/m2   27.26 kg/m2       06/15/2023    9:00 AM 12/30/2021    3:40 PM  Foot/eye exam completion dates  Foot Form Completion Done Done      Updated lab needed at/ before next visit.

## 2023-09-22 NOTE — Assessment & Plan Note (Signed)
 DETERIORATED  Patient re-educated about  the importance of commitment to a  minimum of 150 minutes of exercise per week as able.  The importance of healthy food choices with portion control discussed, as well as eating regularly and within a 12 hour window most days. The need to choose clean , green food 50 to 75% of the time is discussed, as well as to make water the primary drink and set a goal of 64 ounces water daily.       09/21/2023   11:19 AM 06/15/2023    8:55 AM 07/14/2022    2:31 PM  Weight /BMI  Weight 202 lb 195 lb 190 lb  Height 5' 10 (1.778 m) 5' 9 (1.753 m) 5' 10 (1.778 m)  BMI 28.98 kg/m2 28.8 kg/m2 27.26 kg/m2

## 2023-09-30 ENCOUNTER — Other Ambulatory Visit: Payer: Self-pay | Admitting: Family Medicine

## 2023-10-01 ENCOUNTER — Other Ambulatory Visit: Payer: Self-pay | Admitting: Family Medicine

## 2023-11-03 LAB — BMP8+EGFR
BUN/Creatinine Ratio: 20 (ref 12–28)
BUN: 22 mg/dL (ref 8–27)
CO2: 23 mmol/L (ref 20–29)
Calcium: 10.2 mg/dL (ref 8.7–10.3)
Chloride: 103 mmol/L (ref 96–106)
Creatinine, Ser: 1.12 mg/dL — ABNORMAL HIGH (ref 0.57–1.00)
Glucose: 76 mg/dL (ref 70–99)
Potassium: 4.7 mmol/L (ref 3.5–5.2)
Sodium: 140 mmol/L (ref 134–144)
eGFR: 56 mL/min/1.73 — ABNORMAL LOW (ref >59)

## 2023-11-03 LAB — HEMOGLOBIN A1C
Est. average glucose Bld gHb Est-mCnc: 123 mg/dL
Hgb A1c MFr Bld: 5.9 % — ABNORMAL HIGH (ref 4.8–5.6)

## 2023-11-09 ENCOUNTER — Ambulatory Visit (INDEPENDENT_AMBULATORY_CARE_PROVIDER_SITE_OTHER): Admitting: Family Medicine

## 2023-11-09 ENCOUNTER — Encounter: Payer: Self-pay | Admitting: Family Medicine

## 2023-11-09 VITALS — BP 130/72 | HR 83 | Resp 16 | Ht 70.0 in | Wt 194.1 lb

## 2023-11-09 DIAGNOSIS — E785 Hyperlipidemia, unspecified: Secondary | ICD-10-CM

## 2023-11-09 DIAGNOSIS — I10 Essential (primary) hypertension: Secondary | ICD-10-CM | POA: Diagnosis not present

## 2023-11-09 DIAGNOSIS — R7989 Other specified abnormal findings of blood chemistry: Secondary | ICD-10-CM | POA: Diagnosis not present

## 2023-11-09 DIAGNOSIS — E1169 Type 2 diabetes mellitus with other specified complication: Secondary | ICD-10-CM

## 2023-11-09 DIAGNOSIS — E663 Overweight: Secondary | ICD-10-CM

## 2023-11-09 MED ORDER — SEMAGLUTIDE(0.25 OR 0.5MG/DOS) 2 MG/3ML ~~LOC~~ SOPN: PEN_INJECTOR | SUBCUTANEOUS | 2 refills | Status: AC

## 2023-11-09 NOTE — Patient Instructions (Addendum)
 F/Uin January, call if you need me sooner  EXCELLENT labs and blood pressure  It is important that you exercise regularly at least 30 minutes 5 times a week. If you develop chest pain, have severe difficulty breathing, or feel very tired, stop exercising immediately and seek medical attention   Keep the change in diet going its is GREAT  Fasting CBC, lipid, cmp and EGFR, HBA1C, TSH and vit D  3 to 7 days before January appointment  Please get Covid, and flu vaccines   Thanks for choosing [facility], we consider it a privelige to serve you.

## 2023-11-11 ENCOUNTER — Encounter: Payer: Self-pay | Admitting: Family Medicine

## 2023-11-11 NOTE — Progress Notes (Signed)
 Felicia Schneider     MRN: [id number]      DOB: Aug 25, 1965  Chief Complaint  Patient presents with   Medical Management of Chronic Issues    1 month follow up     HPI Felicia Schneider is here for follow up and re-evaluation of chronic medical conditions, medication management and review of any available recent lab and radiology data.  Preventive health is updated, specifically  Cancer screening and Immunization.   Questions or concerns regarding consultations or procedures which the PT has had in the interim are  addressed. The PT denies any adverse reactions to current medications since the last visit.  There are no new concerns.  There are no specific complaints  Denies polyuria, polydipsia, blurred vision , or hypoglycemic episodes. Reports overall improvement in health, good rest, regular exercise, less grieving/ making peace with physical loss of Mom  ROS Denies recent fever or chills. Denies sinus pressure, nasal congestion, ear pain or sore throat. Denies chest congestion, productive cough or wheezing. Denies chest pains, palpitations and leg swelling Denies abdominal pain, nausea, vomiting,diarrhea or constipation.   Denies dysuria, frequency, hesitancy or incontinence. Denies joint pain, swelling and limitation in mobility. Denies headaches, seizures, numbness, or tingling. Denies depression, anxiety or insomnia. Denies skin break down or rash.   PE  BP 130/72   Pulse 83   Resp 16   Ht 5' 10 (1.778 m)   Wt 194 lb 1.9 oz (88.1 kg)   LMP 07/29/2014 (Exact Date)   SpO2 96%   BMI 27.85 kg/m   Patient alert and oriented and in no cardiopulmonary distress.  HEENT: No facial asymmetry, EOMI,     Neck supple .  Chest: Clear to auscultation bilaterally.  CVS: S1, S2 no murmurs, no S3.Regular rate.  ABD: Soft non tender.   Ext: No edema  MS: Adequate ROM spine, shoulders, hips and knees.  Skin: Intact, no ulcerations or rash noted.  Psych: Good eye contact, normal  affect. Memory intact not anxious or depressed appearing.  CNS: CN 2-12 intact, power,  normal throughout.no focal deficits noted.   Assessment & Plan  Type 2 diabetes mellitus with other specified complication (HCC) Improved , whic is great, no med change Diabetes associated with hypertension and hyperlipidemia  Felicia Schneider is reminded of the importance of commitment to daily physical activity for 30 minutes or more, as able and the need to limit carbohydrate intake to 30 to 60 grams per meal to help with blood sugar control.   The need to take medication as prescribed, test blood sugar as directed, and to call between visits if there is a concern that blood sugar is uncontrolled is also discussed.   Felicia Schneider is reminded of the importance of daily foot exam, annual eye examination, and good blood sugar, blood pressure and cholesterol control.     Latest Ref Rng & Units 11/02/2023   12:15 PM 06/08/2023   12:03 PM 06/30/2022   11:47 AM 02/27/2022   11:50 AM 12/28/2021   12:29 PM  Diabetic Labs  HbA1c 4.8 - 5.6 % 5.9  6.1  6.2  6.1    Micro/Creat Ratio 0 - 29 mg/g creat  100   31    Chol 100 - 199 mg/dL  848   868    HDL >60 mg/dL  60   58    Calc LDL 0 - 99 mg/dL  74   57    Triglycerides 0 - 149 mg/dL  94  85    Creatinine 0.57 - 1.00 mg/dL 8.87  8.99  9.02  8.88  1.28       11/09/2023    1:23 PM 11/09/2023    1:22 PM 11/09/2023    1:04 PM 09/21/2023   11:19 AM 06/15/2023    9:57 AM 06/15/2023    8:55 AM 07/14/2022    3:01 PM  BP/Weight  Systolic BP 130 130 146 156 144 153 132  Diastolic BP 72 70 77 81 82 88 82  Wt. (Lbs)   194.12 202  195   BMI   27.85 kg/m2 28.98 kg/m2  28.8 kg/m2       06/15/2023    9:00 AM 12/30/2021    3:40 PM  Foot/eye exam completion dates  Foot Form Completion Done Done        Low vitamin D  level Updated lab needed at/ before next visit.   Overweight (BMI 25.0-29.9) Improved with lifestyle change, great1  Patient re-educated about  the  importance of commitment to a  minimum of 150 minutes of exercise per week as able.  The importance of healthy food choices with portion control discussed, as well as eating regularly and within a 12 hour window most days. The need to choose clean , green food 50 to 75% of the time is discussed, as well as to make water the primary drink and set a goal of 64 ounces water daily.       11/09/2023    1:04 PM 09/21/2023   11:19 AM 06/15/2023    8:55 AM  Weight /BMI  Weight 194 lb 1.9 oz 202 lb 195 lb  Height 5' 10 (1.778 m) 5' 10 (1.778 m) 5' 9 (1.753 m)  BMI 27.85 kg/m2 28.98 kg/m2 28.8 kg/m2      Essential hypertension Controlled, no change in medication DASH diet and commitment to daily physical activity for a minimum of 30 minutes discussed and encouraged, as a part of hypertension management. The importance of attaining a healthy weight is also discussed.     11/09/2023    1:23 PM 11/09/2023    1:22 PM 11/09/2023    1:04 PM 09/21/2023   11:19 AM 06/15/2023    9:57 AM 06/15/2023    8:55 AM 07/14/2022    3:01 PM  BP/Weight  Systolic BP 130 130 146 156 144 153 132  Diastolic BP 72 70 77 81 82 88 82  Wt. (Lbs)   194.12 202  195   BMI   27.85 kg/m2 28.98 kg/m2  28.8 kg/m2        Dyslipidemia, goal LDL below 100 Hyperlipidemia:Low fat diet discussed and encouraged.   Lipid Panel  Lab Results  Component Value Date   CHOL 151 06/08/2023   HDL 60 06/08/2023   LDLCALC 74 06/08/2023   TRIG 94 06/08/2023   CHOLHDL 2.5 06/08/2023     Controlled, no change in medication

## 2023-11-11 NOTE — Assessment & Plan Note (Signed)
 Controlled, no change in medication DASH diet and commitment to daily physical activity for a minimum of 30 minutes discussed and encouraged, as a part of hypertension management. The importance of attaining a healthy weight is also discussed.     11/09/2023    1:23 PM 11/09/2023    1:22 PM 11/09/2023    1:04 PM 09/21/2023   11:19 AM 06/15/2023    9:57 AM 06/15/2023    8:55 AM 07/14/2022    3:01 PM  BP/Weight  Systolic BP 130 130 146 156 144 153 132  Diastolic BP 72 70 77 81 82 88 82  Wt. (Lbs)   194.12 202  195   BMI   27.85 kg/m2 28.98 kg/m2  28.8 kg/m2

## 2023-11-11 NOTE — Assessment & Plan Note (Signed)
 Improved , whic is great, no med change Diabetes associated with hypertension and hyperlipidemia  Felicia Schneider is reminded of the importance of commitment to daily physical activity for 30 minutes or more, as able and the need to limit carbohydrate intake to 30 to 60 grams per meal to help with blood sugar control.   The need to take medication as prescribed, test blood sugar as directed, and to call between visits if there is a concern that blood sugar is uncontrolled is also discussed.   Felicia Schneider is reminded of the importance of daily foot exam, annual eye examination, and good blood sugar, blood pressure and cholesterol control.     Latest Ref Rng & Units 11/02/2023   12:15 PM 06/08/2023   12:03 PM 06/30/2022   11:47 AM 02/27/2022   11:50 AM 12/28/2021   12:29 PM  Diabetic Labs  HbA1c 4.8 - 5.6 % 5.9  6.1  6.2  6.1    Micro/Creat Ratio 0 - 29 mg/g creat  100   31    Chol 100 - 199 mg/dL  848   868    HDL >60 mg/dL  60   58    Calc LDL 0 - 99 mg/dL  74   57    Triglycerides 0 - 149 mg/dL  94   85    Creatinine 0.57 - 1.00 mg/dL 8.87  8.99  9.02  8.88  1.28       11/09/2023    1:23 PM 11/09/2023    1:22 PM 11/09/2023    1:04 PM 09/21/2023   11:19 AM 06/15/2023    9:57 AM 06/15/2023    8:55 AM 07/14/2022    3:01 PM  BP/Weight  Systolic BP 130 130 146 156 144 153 132  Diastolic BP 72 70 77 81 82 88 82  Wt. (Lbs)   194.12 202  195   BMI   27.85 kg/m2 28.98 kg/m2  28.8 kg/m2       06/15/2023    9:00 AM 12/30/2021    3:40 PM  Foot/eye exam completion dates  Foot Form Completion Done Done

## 2023-11-11 NOTE — Assessment & Plan Note (Signed)
 Hyperlipidemia:Low fat diet discussed and encouraged.   Lipid Panel  Lab Results  Component Value Date   CHOL 151 06/08/2023   HDL 60 06/08/2023   LDLCALC 74 06/08/2023   TRIG 94 06/08/2023   CHOLHDL 2.5 06/08/2023     Controlled, no change in medication

## 2023-11-11 NOTE — Assessment & Plan Note (Signed)
 Updated lab needed at/ before next visit.

## 2023-11-11 NOTE — Assessment & Plan Note (Signed)
 Improved with lifestyle change, great1  Patient re-educated about  the importance of commitment to a  minimum of 150 minutes of exercise per week as able.  The importance of healthy food choices with portion control discussed, as well as eating regularly and within a 12 hour window most days. The need to choose clean , green food 50 to 75% of the time is discussed, as well as to make water the primary drink and set a goal of 64 ounces water daily.       11/09/2023    1:04 PM 09/21/2023   11:19 AM 06/15/2023    8:55 AM  Weight /BMI  Weight 194 lb 1.9 oz 202 lb 195 lb  Height 5' 10 (1.778 m) 5' 10 (1.778 m) 5' 9 (1.753 m)  BMI 27.85 kg/m2 28.98 kg/m2 28.8 kg/m2

## 2024-03-28 ENCOUNTER — Ambulatory Visit: Admitting: Family Medicine

## 2024-04-07 ENCOUNTER — Other Ambulatory Visit: Payer: Self-pay | Admitting: Family Medicine

## 2024-04-15 ENCOUNTER — Other Ambulatory Visit: Payer: Self-pay | Admitting: Family Medicine

## 2024-05-02 ENCOUNTER — Ambulatory Visit: Admitting: Family Medicine
